# Patient Record
Sex: Female | Born: 1950 | Race: White | Hispanic: No | Marital: Married | Smoking: Never smoker
Health system: Southern US, Community
[De-identification: ages and names within clinical notes are randomized; demographics above are authoritative.]

## PROBLEM LIST (undated history)

## (undated) DIAGNOSIS — F32A Depression, unspecified: Secondary | ICD-10-CM

## (undated) DIAGNOSIS — F419 Anxiety disorder, unspecified: Secondary | ICD-10-CM

## (undated) DIAGNOSIS — I341 Nonrheumatic mitral (valve) prolapse: Secondary | ICD-10-CM

## (undated) DIAGNOSIS — J189 Pneumonia, unspecified organism: Secondary | ICD-10-CM

## (undated) DIAGNOSIS — J45909 Unspecified asthma, uncomplicated: Secondary | ICD-10-CM

## (undated) DIAGNOSIS — M8580 Other specified disorders of bone density and structure, unspecified site: Secondary | ICD-10-CM

## (undated) DIAGNOSIS — K589 Irritable bowel syndrome without diarrhea: Secondary | ICD-10-CM

## (undated) DIAGNOSIS — K219 Gastro-esophageal reflux disease without esophagitis: Secondary | ICD-10-CM

## (undated) DIAGNOSIS — G039 Meningitis, unspecified: Secondary | ICD-10-CM

## (undated) DIAGNOSIS — N200 Calculus of kidney: Secondary | ICD-10-CM

## (undated) DIAGNOSIS — F329 Major depressive disorder, single episode, unspecified: Secondary | ICD-10-CM

## (undated) DIAGNOSIS — T7840XA Allergy, unspecified, initial encounter: Secondary | ICD-10-CM

## (undated) HISTORY — PX: COSMETIC SURGERY: SHX468

## (undated) HISTORY — DX: Pneumonia, unspecified organism: J18.9

## (undated) HISTORY — DX: Depression, unspecified: F32.A

## (undated) HISTORY — DX: Unspecified asthma, uncomplicated: J45.909

## (undated) HISTORY — PX: KNEE ARTHROSCOPY: SUR90

## (undated) HISTORY — DX: Anxiety disorder, unspecified: F41.9

## (undated) HISTORY — PX: CHOLECYSTECTOMY: SHX55

## (undated) HISTORY — PX: OTHER SURGICAL HISTORY: SHX169

## (undated) HISTORY — DX: Meningitis, unspecified: G03.9

## (undated) HISTORY — DX: Calculus of kidney: N20.0

## (undated) HISTORY — DX: Allergy, unspecified, initial encounter: T78.40XA

## (undated) HISTORY — PX: CARPAL TUNNEL RELEASE: SHX101

## (undated) HISTORY — DX: Other specified disorders of bone density and structure, unspecified site: M85.80

## (undated) HISTORY — DX: Nonrheumatic mitral (valve) prolapse: I34.1

## (undated) HISTORY — PX: SPINE SURGERY: SHX786

## (undated) HISTORY — DX: Irritable bowel syndrome, unspecified: K58.9

## (undated) HISTORY — PX: APPENDECTOMY: SHX54

## (undated) HISTORY — PX: TUBAL LIGATION: SHX77

## (undated) HISTORY — PX: TONSILLECTOMY: SUR1361

## (undated) HISTORY — DX: Major depressive disorder, single episode, unspecified: F32.9

## (undated) HISTORY — DX: Gastro-esophageal reflux disease without esophagitis: K21.9

---

## 1998-09-05 HISTORY — PX: COLONOSCOPY: SHX174

## 2007-04-18 ENCOUNTER — Encounter: Admission: RE | Admit: 2007-04-18 | Discharge: 2007-07-17 | Payer: Self-pay | Admitting: Orthopaedic Surgery

## 2008-04-22 ENCOUNTER — Encounter: Admission: RE | Admit: 2008-04-22 | Discharge: 2008-04-22 | Payer: Self-pay | Admitting: Emergency Medicine

## 2009-07-06 ENCOUNTER — Encounter: Admission: RE | Admit: 2009-07-06 | Discharge: 2009-07-06 | Payer: Self-pay | Admitting: Obstetrics and Gynecology

## 2010-05-05 ENCOUNTER — Encounter (INDEPENDENT_AMBULATORY_CARE_PROVIDER_SITE_OTHER): Payer: Self-pay | Admitting: *Deleted

## 2010-10-05 NOTE — Letter (Signed)
Summary: Pre Visit Letter Revised  [facility]  [address] [place], [place] [address]   Phone: [phone]  Fax: [phone]        05/05/2010 MRN: [id number] [facility] [address] [place], [place]  [address]             Procedure Date:  05/17/2010   Welcome to the Gastroenterology Division at [facility].    You are scheduled to see a nurse for your pre-procedure visit on 05/07/2010 at 9:00AM on the 3rd floor at [facility], [address]  We ask that you try to arrive at our office 15 minutes prior to your appointment time to allow for check-in.  Please take a minute to review the attached form.  If you answer "Yes" to one or more of the questions on the first page, we ask that you call the person listed at your earliest opportunity.  If you answer "No" to all of the questions, please complete the rest of the form and bring it to your appointment.    Your nurse visit will consist of discussing your medical and surgical history, your immediate family medical history, and your medications.   If you are unable to list all of your medications on the form, please bring the medication bottles to your appointment and we will list them.  We will need to be aware of both prescribed and over the counter drugs.  We will need to know exact dosage information as well.    Please be prepared to read and sign documents such as consent forms, a financial agreement, and acknowledgement forms.  If necessary, and with your consent, a friend or relative is welcome to sit-in on the nurse visit with you.  Please bring your insurance card so that we may make a copy of it.  If your insurance requires a referral to see a specialist, please bring your referral form from your primary care physician.  No co-pay is required for this nurse visit.     If you cannot keep your appointment, please call [phone] to cancel or reschedule prior to your appointment date.   This allows [place] the opportunity to schedule an appointment for another patient in need of care.    Thank you for choosing [facility] for your medical needs.  We appreciate the opportunity to care for you.  Please visit [place] at our website  to learn more about our practice.  Sincerely, The Gastroenterology Division

## 2011-11-14 ENCOUNTER — Ambulatory Visit (INDEPENDENT_AMBULATORY_CARE_PROVIDER_SITE_OTHER): Payer: BC Managed Care – PPO | Admitting: Family Medicine

## 2011-11-14 DIAGNOSIS — F419 Anxiety disorder, unspecified: Secondary | ICD-10-CM | POA: Insufficient documentation

## 2011-11-14 DIAGNOSIS — F411 Generalized anxiety disorder: Secondary | ICD-10-CM

## 2011-11-14 DIAGNOSIS — J309 Allergic rhinitis, unspecified: Secondary | ICD-10-CM | POA: Insufficient documentation

## 2011-11-14 DIAGNOSIS — K219 Gastro-esophageal reflux disease without esophagitis: Secondary | ICD-10-CM | POA: Insufficient documentation

## 2011-11-14 MED ORDER — PANTOPRAZOLE SODIUM 40 MG PO TBEC: 40.0000 mg | DELAYED_RELEASE_TABLET | Freq: Every day | ORAL | Status: DC

## 2011-11-14 MED ORDER — ESCITALOPRAM OXALATE 20 MG PO TABS: 20.0000 mg | ORAL_TABLET | Freq: Every day | ORAL | Status: DC

## 2011-11-14 MED ORDER — ALPRAZOLAM 0.5 MG PO TABS: 0.5000 mg | ORAL_TABLET | ORAL | Status: DC | PRN

## 2011-11-14 NOTE — Progress Notes (Signed)
T40-year-old woman, married, works in a [employer] and needs refills on her medications. She has no new problems. Has 2 daughters living area one works at [employer]. and the other one works at [employer]. His intake has not been working and she would like to go back on her Protonix.  Objective: HEENT unremarkable  Chest: Clear  Heart: Regular no murmur  Abdomen: No HSM masses or tenderness  Assessment stable-doing well on current medications, with the exception of her GERD which needs to be better treated with Protonix- but does need refills on other medications  Plan: Refill Xanax x6 months, Lexapro x11 months, Protonix x11 months.

## 2012-04-06 ENCOUNTER — Ambulatory Visit (INDEPENDENT_AMBULATORY_CARE_PROVIDER_SITE_OTHER): Payer: BC Managed Care – PPO | Admitting: Family Medicine

## 2012-04-06 VITALS — BP 134/80 | HR 60 | Temp 98.2°F | Resp 16 | Ht 62.5 in | Wt 122.0 lb

## 2012-04-06 DIAGNOSIS — M79606 Pain in leg, unspecified: Secondary | ICD-10-CM

## 2012-04-06 DIAGNOSIS — N390 Urinary tract infection, site not specified: Secondary | ICD-10-CM

## 2012-04-06 DIAGNOSIS — M79609 Pain in unspecified limb: Secondary | ICD-10-CM

## 2012-04-06 DIAGNOSIS — R300 Dysuria: Secondary | ICD-10-CM

## 2012-04-06 DIAGNOSIS — J329 Chronic sinusitis, unspecified: Secondary | ICD-10-CM

## 2012-04-06 LAB — POCT URINALYSIS DIPSTICK
Blood, UA: NEGATIVE
Glucose, UA: NEGATIVE
Spec Grav, UA: 1.02
pH, UA: 7.5

## 2012-04-06 LAB — POCT UA - MICROSCOPIC ONLY: Mucus, UA: POSITIVE

## 2012-04-06 MED ORDER — OXAPROZIN 600 MG PO TABS: ORAL_TABLET | ORAL | Status: AC

## 2012-04-06 MED ORDER — LEVOFLOXACIN 500 MG PO TABS: 500.0000 mg | ORAL_TABLET | Freq: Every day | ORAL | Status: DC

## 2012-04-06 NOTE — Patient Instructions (Signed)
Take the anti-inflammatory medicine twice daily as needed for the leg pain. If pain problems persist return for further assessment.  Take the antibiotic one daily. This should cover both urine and sinus problems well.  Drink plenty of fluids.

## 2012-04-06 NOTE — Progress Notes (Signed)
     Results for orders placed in visit on 04/06/12  POCT URINALYSIS DIPSTICK      Component Value Range   Color, UA yellow     Clarity, UA cloudy     Glucose, UA neg     Bilirubin, UA neg     Ketones, UA neg     Spec Grav, UA 1.020     Blood, UA neg     pH, UA 7.5     Protein, UA trace     Urobilinogen, UA 0.2     Nitrite, UA neg     Leukocytes, UA small (1+)    POCT UA - MICROSCOPIC ONLY      Component Value Range   WBC, Ur, HPF, POC tntc     RBC, urine, microscopic 2-7     Bacteria, U Microscopic 4+     Mucus, UA pos     Epithelial cells, urine per micros 4-6     Crystals, Ur, HPF, POC neg     Casts, Ur, LPF, POC neg     Yeast, UA neg

## 2012-04-28 ENCOUNTER — Other Ambulatory Visit: Payer: Self-pay | Admitting: Family Medicine

## 2012-06-10 ENCOUNTER — Encounter: Payer: Self-pay | Admitting: Internal Medicine

## 2012-06-10 ENCOUNTER — Ambulatory Visit: Payer: Self-pay | Admitting: Internal Medicine

## 2012-06-10 VITALS — BP 120/72 | HR 64 | Temp 98.0°F | Resp 16 | Ht 62.5 in | Wt 122.4 lb

## 2012-06-10 DIAGNOSIS — Z7189 Other specified counseling: Secondary | ICD-10-CM

## 2012-06-10 DIAGNOSIS — F419 Anxiety disorder, unspecified: Secondary | ICD-10-CM

## 2012-06-10 DIAGNOSIS — F411 Generalized anxiety disorder: Secondary | ICD-10-CM

## 2012-06-10 MED ORDER — ALPRAZOLAM 0.5 MG PO TABS: 0.5000 mg | ORAL_TABLET | Freq: Two times a day (BID) | ORAL | Status: DC | PRN

## 2012-06-10 NOTE — Patient Instructions (Addendum)
Stress Stress-related medical problems are becoming increasingly common. The body has a built-in physical response to stressful situations. Faced with pressure, challenge or danger, we need to react quickly. Our bodies release hormones such as cortisol and adrenaline to help do this. These hormones are part of the "fight or flight" response and affect the metabolic rate, heart rate and blood pressure, resulting in a heightened, stressed state that prepares the body for optimum performance in dealing with a stressful situation. It is likely that early man required these mechanisms to stay alive, but usually modern stresses do not call for this, and the same hormones released in today's world can damage health and reduce coping ability. CAUSES  Pressure to perform at work, at school or in sports.  Threats of physical violence.  Money worries.  Arguments.  Family conflicts.  Divorce or separation from significant other.  Bereavement.  New job or unemployment.  Changes in location.  Alcohol or drug abuse. SOMETIMES, THERE IS NO PARTICULAR REASON FOR DEVELOPING STRESS. Almost all people are at risk of being stressed at some time in their lives. It is important to know that some stress is temporary and some is long term.  Temporary stress will go away when a situation is resolved. Most people can cope with short periods of stress, and it can often be relieved by relaxing, taking a walk, chatting through issues with friends, or having a good night's sleep.  Chronic (long-term, continuous) stress is much harder to deal with. It can be psychologically and emotionally damaging. It can be harmful both for an individual and for friends and family. SYMPTOMS Everyone reacts to stress differently. There are some common effects that help us recognize it. In times of extreme stress, people may:  Shake uncontrollably.  Breathe faster and deeper than normal (hyperventilate).  Vomit.  For people  with asthma, stress can trigger an attack.  For some people, stress may trigger migraine headaches, ulcers, and body pain. PHYSICAL EFFECTS OF STRESS MAY INCLUDE:  Loss of energy.  Skin problems.  Aches and pains resulting from tense muscles, including neck ache, backache and tension headaches.  Increased pain from arthritis and other conditions.  Irregular heart beat (palpitations).  Periods of irritability or anger.  Apathy or depression.  Anxiety (feeling uptight or worrying).  Unusual behavior.  Loss of appetite.  Comfort eating.  Lack of concentration.  Loss of, or decreased, sex-drive.  Increased smoking, drinking, or recreational drug use.  For women, missed periods.  Ulcers, joint pain, and muscle pain. Post-traumatic stress is the stress caused by any serious accident, strong emotional damage, or extremely difficult or violent experience such as rape or war. Post-traumatic stress victims can experience mixtures of emotions such as fear, shame, depression, guilt or anger. It may include recurrent memories or images that may be haunting. These feelings can last for weeks, months or even years after the traumatic event that triggered them. Specialized treatment, possibly with medicines and psychological therapies, is available. If stress is causing physical symptoms, severe distress or making it difficult for you to function as normal, it is worth seeing your caregiver. It is important to remember that although stress is a usual part of life, extreme or prolonged stress can lead to other illnesses that will need treatment. It is better to visit a doctor sooner rather than later. Stress has been linked to the development of high blood pressure and heart disease, as well as insomnia and depression. There is no diagnostic test for   stress since everyone reacts to it differently. But a caregiver will be able to spot the physical symptoms, such  as:  Headaches.  Shingles.  Ulcers. Emotional distress such as intense worry, low mood or irritability should be detected when the doctor asks pertinent questions to identify any underlying problems that might be the cause. In case there are physical reasons for the symptoms, the doctor may also want to do some tests to exclude certain conditions. If you feel that you are suffering from stress, try to identify the aspects of your life that are causing it. Sometimes you may not be able to change or avoid them, but even a small change can have a positive ripple effect. A simple lifestyle change can make all the difference. STRATEGIES THAT CAN HELP DEAL WITH STRESS:  Delegating or sharing responsibilities.  Avoiding confrontations.  Learning to be more assertive.  Regular exercise.  Avoid using alcohol or street drugs to cope.  Eating a healthy, balanced diet, rich in fruit and vegetables and proteins.  Finding humor or absurdity in stressful situations.  Never taking on more than you know you can handle comfortably.  Organizing your time better to get as much done as possible.  Talking to friends or family and sharing your thoughts and fears.  Listening to music or relaxation tapes.  Tensing and then relaxing your muscles, starting at the toes and working up to the head and neck. If you think that you would benefit from help, either in identifying the things that are causing your stress or in learning techniques to help you relax, see a caregiver who is capable of helping you with this. Rather than relying on medications, it is usually better to try and identify the things in your life that are causing stress and try to deal with them. There are many techniques of managing stress including counseling, psychotherapy, aromatherapy, yoga, and exercise. Your caregiver can help you determine what is best for you. Document Released: 11/12/2002 Document Revised: 11/14/2011 Document  Reviewed: 10/09/2007 ExitCare Patient Information 2013 [employer]. Anxiety and Panic Attacks Your caregiver has informed you that you are having an anxiety or panic attack. There may be many forms of this. Most of the time these attacks come suddenly and without warning. They come at any time of day, including periods of sleep, and at any time of life. They may be strong and unexplained. Although panic attacks are very scary, they are physically harmless. Sometimes the cause of your anxiety is not known. Anxiety is a protective mechanism of the body in its fight or flight mechanism. Most of these perceived danger situations are actually nonphysical situations (such as anxiety over losing a job). CAUSES  The causes of an anxiety or panic attack are many. Panic attacks may occur in otherwise healthy people given a certain set of circumstances. There may be a genetic cause for panic attacks. Some medications may also have anxiety as a side effect. SYMPTOMS  Some of the most common feelings are:  Intense terror.  Dizziness, feeling faint.  Hot and cold flashes.  Fear of going crazy.  Feelings that nothing is real.  Sweating.  Shaking.  Chest pain or a fast heartbeat (palpitations).  Smothering, choking sensations.  Feelings of impending doom and that death is near.  Tingling of extremities, this may be from over-breathing.  Altered reality (derealization).  Being detached from yourself (depersonalization). Several symptoms can be present to make up anxiety or panic attacks. DIAGNOSIS  The evaluation   by your caregiver will depend on the type of symptoms you are experiencing. The diagnosis of anxiety or panic attack is made when no physical illness can be determined to be a cause of the symptoms. TREATMENT  Treatment to prevent anxiety and panic attacks may include:  Avoidance of circumstances that cause anxiety.  Reassurance and relaxation.  Regular exercise.  Relaxation  therapies, such as yoga.  Psychotherapy with a psychiatrist or therapist.  Avoidance of caffeine, alcohol and illegal drugs.  Prescribed medication. SEEK IMMEDIATE MEDICAL CARE IF:   You experience panic attack symptoms that are different than your usual symptoms.  You have any worsening or concerning symptoms. Document Released: 08/22/2005 Document Revised: 11/14/2011 Document Reviewed: 12/24/2009 ExitCare Patient Information 2013 [employer].  

## 2012-06-10 NOTE — Progress Notes (Signed)
  Subjective:    Patient ID: Tina Bray, female    DOB: 11/25/1950, 61 y.o.   MRN: [id number]  HPI Stable and doing well Stress up at work Husband has new job and both do not have insurance. Has counseled in the past  Review of Systems     Objective:   Physical Exam Cor nl       Assessment & Plan:  Refill meds 6 months Exercise

## 2012-08-13 ENCOUNTER — Ambulatory Visit: Payer: Self-pay | Admitting: Internal Medicine

## 2012-08-13 VITALS — BP 147/72 | HR 69 | Temp 98.6°F | Resp 18 | Ht 63.5 in | Wt 123.8 lb

## 2012-08-13 DIAGNOSIS — J329 Chronic sinusitis, unspecified: Secondary | ICD-10-CM

## 2012-08-13 MED ORDER — CIPROFLOXACIN HCL 500 MG PO TABS: 500.0000 mg | ORAL_TABLET | Freq: Two times a day (BID) | ORAL | Status: DC

## 2012-08-13 NOTE — Progress Notes (Signed)
  Subjective:    Patient ID: Tina Bray, female    DOB: [birth year], 61 y.o.   MRN: [id number]  HPI Pt presents to clinic today with some sinusitis complaints. Stuffy, bloody nose, drainage in her throat and a dried out feeling.   Review of Systems     Objective:   Physical Exam        Assessment & Plan:

## 2012-08-13 NOTE — Progress Notes (Signed)
  Subjective:    Patient ID: Tina Bray, female    DOB: 03-12-1951, 61 y.o.   MRN: [id number]  HPI had a flulike illness for 4 days which has developed  into epistaxis with purulent nasal discharge and sinus pressure/no fever/sore throat only in the morning/cough is minimal and nonproductive Has a history of recurrent sinus infections    Review of Systems     Objective:   Physical Exam Vital signs stable Conjunctiva clear Tympanic membranes clear Nares with purulent discharge/there several bleeding points on the turbinates and nasal septum Oral pharynx is clear except for purulent postnasal drainage No anterior cervical notes Lungs clear to auscultation       Assessment & Plan:  Problem #1 sinusitis  History of penicillin allergy/history of not responding to Zithromax/history of doing well with Cipro Meds ordered this encounter  Medications         . ciprofloxacin (CIPRO) 500 MG tablet    Sig: Take 1 tablet (500 mg total) by mouth 2 (two) times daily.    Dispense:  20 tablet    Refill:  0   otc meds

## 2012-11-07 ENCOUNTER — Other Ambulatory Visit: Payer: Self-pay | Admitting: Family Medicine

## 2012-11-12 ENCOUNTER — Ambulatory Visit: Payer: Self-pay | Admitting: Family Medicine

## 2012-11-12 VITALS — BP 122/74 | HR 68 | Temp 98.2°F | Resp 16 | Ht 62.0 in | Wt 125.0 lb

## 2012-11-12 DIAGNOSIS — K219 Gastro-esophageal reflux disease without esophagitis: Secondary | ICD-10-CM

## 2012-11-12 DIAGNOSIS — F411 Generalized anxiety disorder: Secondary | ICD-10-CM

## 2012-11-12 DIAGNOSIS — J309 Allergic rhinitis, unspecified: Secondary | ICD-10-CM

## 2012-11-12 MED ORDER — PANTOPRAZOLE SODIUM 40 MG PO TBEC: 40.0000 mg | DELAYED_RELEASE_TABLET | Freq: Every day | ORAL | Status: DC

## 2012-11-12 MED ORDER — ESCITALOPRAM OXALATE 20 MG PO TABS: 20.0000 mg | ORAL_TABLET | Freq: Every day | ORAL | Status: DC

## 2012-11-12 NOTE — Patient Instructions (Signed)
I have sent in your medicines for you.  They should be waiting at the pharmacy.  Let us know if the nasal sprays aren't helping anymore.    It was good to meet you today!

## 2012-11-12 NOTE — Progress Notes (Signed)
Tina Bray is a 62 y.o. female who presents to [facility] today with multiple complaints:    1.  Anxiety:  Present for past 4-5 years.  She has been tried on multiple medications and has been on combination of Xanax and Lexapro for over 2 years.  This seems to have calmed her nerves the most.  No SI/HI.  No symptoms of depression.  No panic attacks for over a year.  Requesting refill for Lexapro today.  Describes anxiety as worse without medications and aggravated by going out in public.   2.  GERD:  Present for years as well.  Has been on multipole PPIs, currently controlled on Protonix.  Asking for refill for this today as well.  Worsened by spicy foods.   3.  Allergic rhinitis:  Chronic.  Describes rhinorrhea which is clear as well as runny, itchy eyes.  Worse in fall and spring.  Has just started this past weekend to flare up.  Has not tried any OTC 2nd gen H1 blockers, only benadryl.  Does have multiple nasal sprays at home but doesn't know what these actually do.  No fevers/chills.   The following portions of the patient's history were reviewed and updated as appropriate: allergies, current medications, past medical history, family and social history, and problem list.  Patient is a nonsmoker.    Past Medical History  Diagnosis Date  . Allergy   . Depression   . Asthma   . Anxiety    Past Surgical History  Procedure Laterality Date  . Appendectomy    . Cholecystectomy    . Cosmetic surgery    . Spine surgery    . Tubal ligation    . Carpal tunnel release    . Orthoscopic shoulder      right and left shoulder    Medications reviewed. Current Outpatient Prescriptions  Medication Sig Dispense Refill  . ALPRAZolam (XANAX) 0.5 MG tablet Take 1 tablet (0.5 mg total) by mouth 2 (two) times daily as needed.  60 tablet  3  . b complex vitamins tablet Take 1 tablet by mouth daily.      . cycloSPORINE (RESTASIS) 0.05 % ophthalmic emulsion 1 drop 2 (two) times daily.      Marland Kitchen  escitalopram (LEXAPRO) 20 MG tablet Take 1 tablet (20 mg total) by mouth daily.  90 tablet  3  . ipratropium (ATROVENT) 0.03 % nasal spray USE TWO DOSES PER NOSTRIL EVERY 6 HOURS AS NEEDED  30 mL  1  . oxaprozin (DAYPRO) 600 MG tablet One twice daily with food for leg pain  30 tablet  1  . pantoprazole (PROTONIX) 40 MG tablet Take 1 tablet (40 mg total) by mouth daily.  90 tablet  2  . ranitidine (ZANTAC) 75 MG tablet Take 75 mg by mouth 2 (two) times daily.      . cetirizine (ZYRTEC) 10 MG tablet Take 10 mg by mouth daily.      . ciprofloxacin (CIPRO) 500 MG tablet Take 1 tablet (500 mg total) by mouth 2 (two) times daily.  20 tablet  0  . diphenhydrAMINE (BENADRYL) 25 MG tablet Take 25 mg by mouth at bedtime and may repeat dose one time if needed.      . loteprednol (LOTEMAX) 0.2 % SUSP 1 drop 4 (four) times daily.       No current facility-administered medications for this visit.    ROS as above otherwise neg.  No chest pain, palpitations, SOB, Fever, Chills,  Abd pain, N/V/D.  Physical Exam:  BP 122/74  Pulse 68  Temp(Src) 98.2 F (36.8 C) (Oral)  Resp 16  Ht 5\' 2"  (1.575 m)  Wt 125 lb (56.7 kg)  BMI 22.86 kg/m2  SpO2 99% Gen:  Alert, cooperative patient who appears stated age in no acute distress.  Vital signs reviewed. HEENT:  No nasal/ocular drainage currently.  EOMI,  MMM Pulm:  Clear to auscultation bilaterally with good air movement.  No wheezes or rales noted.   Cardiac:  Regular rate and rhythm without murmur auscultated.  Good S1/S2. Abd:  Soft/nondistended/nontender.  Good bowel sounds throughout all four quadrants.  No masses noted.  Exts: Non edematous BL  LE, warm and well perfused. Psych:  Pleasant, conversant, not anxious appearing currently.    No results found for this or any previous visit (from the past 72 hour(s)).  Assessment/Plan: 1.  Anxiety: - Controlled on lexapro, refill provided today  2.  GERD: - Controlled on Protonix, refill provided  3.   Allergic rhinitis: - Discussed purpose of intranasal corticosteroids.   - She has multiple of these at home, no refills needed - TO attempt intranasal steroids and OTC H1 blockers for now.  If no relief in 2-3 weeks she is to return for possible referral to allergy specialist.  She has seen several of these in the past evidently.

## 2013-07-03 ENCOUNTER — Ambulatory Visit: Payer: Self-pay

## 2013-07-03 ENCOUNTER — Ambulatory Visit: Payer: Self-pay | Admitting: Family Medicine

## 2013-07-03 VITALS — BP 144/80 | HR 65 | Temp 98.3°F | Resp 18 | Ht 62.5 in | Wt 126.0 lb

## 2013-07-03 DIAGNOSIS — S161XXA Strain of muscle, fascia and tendon at neck level, initial encounter: Secondary | ICD-10-CM

## 2013-07-03 DIAGNOSIS — M542 Cervicalgia: Secondary | ICD-10-CM

## 2013-07-03 DIAGNOSIS — S43499A Other sprain of unspecified shoulder joint, initial encounter: Secondary | ICD-10-CM

## 2013-07-03 DIAGNOSIS — S46812A Strain of other muscles, fascia and tendons at shoulder and upper arm level, left arm, initial encounter: Secondary | ICD-10-CM

## 2013-07-03 DIAGNOSIS — M545 Low back pain, unspecified: Secondary | ICD-10-CM

## 2013-07-03 DIAGNOSIS — S139XXA Sprain of joints and ligaments of unspecified parts of neck, initial encounter: Secondary | ICD-10-CM

## 2013-07-03 MED ORDER — DICLOFENAC SODIUM 75 MG PO TBEC: 75.0000 mg | DELAYED_RELEASE_TABLET | Freq: Two times a day (BID) | ORAL | Status: DC

## 2013-07-03 MED ORDER — METAXALONE 800 MG PO TABS: 800.0000 mg | ORAL_TABLET | Freq: Three times a day (TID) | ORAL | Status: DC
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
## 2013-07-03 NOTE — Patient Instructions (Signed)
Take the Skelaxin one half to one pill 3 times daily for muscle relaxant as needed  Take the diclofenac( voltaren) one twice daily with breakfast and supper  Return only if needed

## 2013-08-28 ENCOUNTER — Ambulatory Visit: Payer: Self-pay | Admitting: Family Medicine

## 2013-08-28 VITALS — BP 118/78 | HR 79 | Temp 99.3°F | Resp 16 | Ht 62.5 in | Wt 124.6 lb

## 2013-08-28 DIAGNOSIS — Z7189 Other specified counseling: Secondary | ICD-10-CM

## 2013-08-28 DIAGNOSIS — F419 Anxiety disorder, unspecified: Secondary | ICD-10-CM

## 2013-08-28 DIAGNOSIS — J069 Acute upper respiratory infection, unspecified: Secondary | ICD-10-CM

## 2013-08-28 MED ORDER — HYDROCODONE-HOMATROPINE 5-1.5 MG/5ML PO SYRP: 5.0000 mL | ORAL_SOLUTION | Freq: Three times a day (TID) | ORAL | Status: DC | PRN

## 2013-08-28 MED ORDER — CIPROFLOXACIN HCL 500 MG PO TABS: 500.0000 mg | ORAL_TABLET | Freq: Two times a day (BID) | ORAL | Status: DC

## 2013-08-28 MED ORDER — ALPRAZOLAM 0.5 MG PO TABS: 0.5000 mg | ORAL_TABLET | Freq: Two times a day (BID) | ORAL | Status: DC | PRN

## 2013-08-28 NOTE — Progress Notes (Signed)
°  This chart was scribed for Elvina Sidle, MD by Joaquin Music, ED Scribe. This patient was seen in room Room/bed 10 and the patient's care was started at 7:54 AM Subjective:    Patient ID: Tina Bray, female    DOB: [birth year], 62 y.o.   MRN: [id number] Chief Complaint  Patient presents with   Cough    symptoms since monday    Otalgia   Headache   Sore Throat   HPI Tina Bray is a 62 y.o. female who presents to the [facility] complaining of ongoing cough, otalgia, HA, and sore throat for the past 3 week. Pt states her husband was recently sick and seen at [facility]. Pt denies hx of asthma but states she has had bronchitis. She states she has an inhaler but denies using it since she has moved from [place] this year (2014). Pt denies hx of smoking.  Pt requested to have her Zanax rx refilled.  Pt states she works for [employer]. 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  HENT: Positive for congestion, ear pain, rhinorrhea and sore throat.   Respiratory: Positive for cough.   Neurological: Positive for headaches.   Objective:   Physical Exam  Constitutional: She is oriented to person, place, and time. She appears well-developed and well-nourished.  HENT:  Unremarkable except for mucopurulent from nose  Eyes: Conjunctivae are normal. Pupils are equal, round, and reactive to light.  Neck: Neck supple.  Cardiovascular: Normal rate, regular rhythm and normal heart sounds.   No murmur heard. Pulmonary/Chest: Breath sounds normal. No respiratory distress. She has no wheezes. She has no rales.  Lymphadenopathy:    She has no cervical adenopathy.  Neurological: She is alert and oriented to person, place, and time.  Skin: Skin is warm and dry. No rash noted. No erythema.  Without suspicious lesions   Psychiatric: She has a normal mood and affect. Her behavior is normal.    Triage Vitals:BP 118/78   Pulse 79   Temp(Src) 99.3 F (37.4 C) (Oral)   Resp 16   Ht 5' 2.5" (1.588 m)   Wt 124 lb 9.6 oz (56.518 kg)   BMI 22.41 kg/m2   SpO2 96% Assessment & Plan:   1. Anxiety   2. Encounter for medication review and counseling   3. URI (upper respiratory infection)     Anxiety - Plan: ALPRAZolam (XANAX) 0.5 MG tablet  Encounter for medication review and counseling - Plan: ALPRAZolam (XANAX) 0.5 MG tablet  URI (upper respiratory infection) - Plan: ciprofloxacin (CIPRO) 500 MG tablet, HYDROcodone-homatropine (HYCODAN) 5-1.5 MG/5ML syrup  Signed, Elvina Sidle, MD

## 2013-11-19 ENCOUNTER — Ambulatory Visit: Payer: BC Managed Care – PPO | Admitting: Family Medicine

## 2013-11-19 VITALS — BP 132/84 | HR 81 | Temp 98.4°F | Resp 18 | Ht 62.5 in | Wt 124.0 lb

## 2013-11-19 DIAGNOSIS — K219 Gastro-esophageal reflux disease without esophagitis: Secondary | ICD-10-CM

## 2013-11-19 DIAGNOSIS — F411 Generalized anxiety disorder: Secondary | ICD-10-CM

## 2013-11-19 MED ORDER — ESCITALOPRAM OXALATE 20 MG PO TABS: 20.0000 mg | ORAL_TABLET | Freq: Every day | ORAL | Status: DC

## 2013-11-19 MED ORDER — PANTOPRAZOLE SODIUM 40 MG PO TBEC: 40.0000 mg | DELAYED_RELEASE_TABLET | Freq: Every day | ORAL | Status: DC

## 2013-11-19 NOTE — Progress Notes (Signed)
° °  Subjective:    Patient ID: Tina Bray, female    DOB: 1950-10-15, 63 y.o.   MRN: [id number] This chart was scribed for Tina Haber, MD by Anastasia Pall, ED Scribe. This patient was seen in room 01 and the patient's care was started at 3:29 PM.  Chief Complaint  Patient presents with   rx refills    lexapro, protonix, estradiol   HPI Tina Bray is a 63 y.o. female Pt presents for Rx refills of Lexapro, Protonix, and Estrace. She states her medications have been working well for her and has been on them for a long time. She denies any other requests, denies symptoms. She states she is due for mammogram and colonoscopy. She reports working at [employer] for her profession.   PCP Reginia Forts, MD  Patient Active Problem List   Diagnosis Date Noted   Anxiety 11/14/2011   GERD (gastroesophageal reflux disease) 11/14/2011   Allergic rhinitis 11/14/2011   Prior to Admission medications   Medication Sig Start Date End Date Taking? Authorizing Provider  ALPRAZolam Duanne Moron) 0.5 MG tablet Take 1 tablet (0.5 mg total) by mouth 2 (two) times daily as needed. 08/28/13  Yes Tina Haber, MD  b complex vitamins tablet Take 1 tablet by mouth daily.   Yes Historical Provider, MD  cetirizine (ZYRTEC) 10 MG tablet Take 10 mg by mouth daily.   Yes Historical Provider, MD  escitalopram (LEXAPRO) 20 MG tablet Take 1 tablet (20 mg total) by mouth daily. 11/12/12  Yes Alveda Reasons, MD  estradiol (ESTRACE) 0.1 MG/GM vaginal cream Place 0.2 Applicatorfuls vaginally once a week. 2 times a week   Yes Historical Provider, MD  pantoprazole (PROTONIX) 40 MG tablet Take 1 tablet (40 mg total) by mouth daily. 11/12/12  Yes Alveda Reasons, MD   Review of Systems  Constitutional: Negative for fever.  Psychiatric/Behavioral: Negative for dysphoric mood. The patient is not nervous/anxious.    BP 132/84   Pulse 81   Temp(Src) 98.4 F (36.9 C) (Oral)   Resp 18   Ht 5' 2.5" (1.588  m)   Wt 124 lb (56.246 kg)   BMI 22.30 kg/m2   SpO2 98%    Objective:   Physical Exam Nursing note and vitals reviewed. Constitutional: Pt is oriented to person, place, and time. Pt appears well-developed and well-nourished. No distress.  HENT: Right and left external ear normal.  Head: Normocephalic and atraumatic.  Eyes: EOM are normal.  Neck: Neck supple. No thyromegaly. No cervical adenopathy.  Cardiovascular: Normal rate, regular rhythm and normal heart sounds.  Exam reveals no gallop and no friction rub. No murmur heard. Pulmonary/Chest: Effort normal and breath sounds normal. No respiratory distress. Pt has no wheezes. Pt has no rales.  Abdominal: Soft. Bowel sounds are normal. There is no tenderness. There is no rebound and no guarding. No hepatosplenomegaly. No CVA tenderness.  Musculoskeletal: Normal range of motion. No tenderness. No edema.   Neurological: Pt is alert and oriented to person, place, and time.  Skin: Skin is warm and dry.  Psychiatric: Pt has a normal mood and affect. Pt's behavior is normal.     Assessment & Plan:   GERD (gastroesophageal reflux disease) - Plan: pantoprazole (PROTONIX) 40 MG tablet  Anxiety state, unspecified - Plan: escitalopram (LEXAPRO) 20 MG tablet  Estrogen gel also refilled. Signed, Tina Haber, MD

## 2013-12-11 ENCOUNTER — Encounter: Payer: Self-pay | Admitting: Family Medicine

## 2013-12-11 ENCOUNTER — Ambulatory Visit (INDEPENDENT_AMBULATORY_CARE_PROVIDER_SITE_OTHER): Payer: BC Managed Care – PPO | Admitting: Family Medicine

## 2013-12-11 VITALS — BP 131/74 | HR 65 | Temp 98.6°F | Resp 16 | Ht 62.5 in | Wt 124.4 lb

## 2013-12-11 DIAGNOSIS — Z01419 Encounter for gynecological examination (general) (routine) without abnormal findings: Secondary | ICD-10-CM

## 2013-12-11 DIAGNOSIS — F419 Anxiety disorder, unspecified: Secondary | ICD-10-CM

## 2013-12-11 DIAGNOSIS — Z0000 Encounter for general adult medical examination without abnormal findings: Secondary | ICD-10-CM

## 2013-12-11 DIAGNOSIS — Z23 Encounter for immunization: Secondary | ICD-10-CM

## 2013-12-11 DIAGNOSIS — Z1239 Encounter for other screening for malignant neoplasm of breast: Secondary | ICD-10-CM

## 2013-12-11 DIAGNOSIS — Z1211 Encounter for screening for malignant neoplasm of colon: Secondary | ICD-10-CM

## 2013-12-11 LAB — CBC WITH DIFFERENTIAL/PLATELET
Basophils Absolute: 0 10*3/uL (ref 0.0–0.1)
Basophils Relative: 1 % (ref 0–1)
Eosinophils Absolute: 0.1 10*3/uL (ref 0.0–0.7)
Eosinophils Relative: 3 % (ref 0–5)
HEMATOCRIT: 42.6 % (ref 36.0–46.0)
HEMOGLOBIN: 14.2 g/dL (ref 12.0–15.0)
Lymphocytes Relative: 37 % (ref 12–46)
Lymphs Abs: 1.5 10*3/uL (ref 0.7–4.0)
MCH: 29.7 pg (ref 26.0–34.0)
MCHC: 33.3 g/dL (ref 30.0–36.0)
MCV: 89.1 fL (ref 78.0–100.0)
Monocytes Absolute: 0.2 10*3/uL (ref 0.1–1.0)
Monocytes Relative: 6 % (ref 3–12)
NEUTROS ABS: 2.2 10*3/uL (ref 1.7–7.7)
Neutrophils Relative %: 53 % (ref 43–77)
PLATELETS: 237 10*3/uL (ref 150–400)
RBC: 4.78 MIL/uL (ref 3.87–5.11)
RDW: 13.8 % (ref 11.5–15.5)
WBC: 4.1 10*3/uL (ref 4.0–10.5)

## 2013-12-11 LAB — POCT URINALYSIS DIPSTICK
BILIRUBIN UA: NEGATIVE
GLUCOSE UA: NEGATIVE
Ketones, UA: NEGATIVE
Leukocytes, UA: NEGATIVE
Nitrite, UA: NEGATIVE
Protein, UA: NEGATIVE
RBC UA: NEGATIVE
Spec Grav, UA: 1.025
UROBILINOGEN UA: 0.2
pH, UA: 5.5

## 2013-12-11 LAB — IFOBT (OCCULT BLOOD): IFOBT: NEGATIVE

## 2013-12-11 NOTE — Progress Notes (Signed)
Subjective:   This chart was scribed for Tina Honour, MD by [name], [facility] Scribe. This patient was seen in room 22 and the patient's care was started 2:02 PM.    Patient ID: Tina Bray, female    DOB: 03-20-1951, 63 y.o.   MRN: [id number]  12/11/2013  Annual Exam, Hand Pain and Elbow Pain   HPI  HPI Comments: Tina Bray is a 63 y.o. female who presents to [facility] for a complete physical examination today.  Pt has fragrance induced Asthma. She is not established with an allergist here in [place] just yet.  Her PSHx includes Appendectomy, cholecystomy , cervical disc fusion of 5 and 6, rotator cuff surgery, carpel tunnel bilaterally, and a Tonsillectomy.  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 States she experienced ongoing pain and tingling after her surgery and underwent a second surgery to manage symptoms.  Pt was hospitalized when she broke her Tibia. She was admitted to monitor swelling and to manage pain.  She exercises regularly, consisting of walking and running.  Pt states her hearing is well. No dizziness or tinnitus. Reports HA with worsening stress. Denies any associated nausea or vomiting with her HA.  Experiences her heart racing with worsening anxiety. However, she denies any racing of her heart at baseline.  States she using sun screen regularly. However, she denies using any on her face.  Pt reports regular bowel movements, however, not every single day. States she experiences constipation intermittently which resolves with OTC supplements.  Denies having to get up in the middle of the night to use the rest room.  She uses her Xanax as needed, about 2 halfs within 1 week period. She is also taking B complex, Zyrtec, vitamin D supplement, Lexapro, and Estrace, and Protonix.  At this time she denies any mouth sores, blurred vision, chest  pain with or without exertion, SOB, cough, numbness, tingling, or neck pain.  Denies any breast cancer in her family.  Mother is living, now age 41. She was diagnosed with cancer, unaware of what kind. She also has HTN and heart issues, had stents placed 6 years ago. Denies any MI's. Father passed at age 24 of a stroke. Pt has a sister, unaware of health status. She has an older brother, and states he is in good health with an known heart condition- Unaware of any details. Younger brother has back problems and Hepatitis D.  Pt has been happily married for 14 years. Denies any abuse in current marriage. 2 daughters by her first husband. 105 27 year old grandson. She lives with her husband and cat. She is currently a Sales promotion account executive. She moved from [place] and is currently happy in [place].   Last CPE: November 2013- Dr. Gertie Fey at [facility] Last Pap Smear: November 2013- Dr. Gertie Fey at [facility] Bone Density: November 2013- Dr. Gertie Fey at [facility]; Osteopenia Last Mammogram: November 2013- Dr. Gertie Fey at [facility] Last Tetanus: 2001 Shingles Vaccination: Has never had this vaccination  Flu Vaccination: Received one for this season Pneumonia Vaccination: Over 10 years- From allergist Last Dental visit: She has seen her dentist within the last few months Last Eye visit: January 2015- Starting to get a cataract in R eye Last Colonoscopy: At age 66- Completely normal without polyps-plans to schedule repeat colonoscopy with Dr. Louretta Shorten at [facility]  Constitutional: Negative for fever, chills, diaphoresis, activity change, appetite change, fatigue and unexpected weight change.  HENT: Negative for congestion, ear pain, hearing loss, postnasal drip, rhinorrhea, sinus pressure, sore throat, tinnitus and trouble swallowing.   Eyes: Negative for pain, discharge, redness, itching and visual disturbance.  Respiratory: Negative for  cough, choking, chest tightness, shortness of breath, wheezing and stridor.   Cardiovascular: Negative for chest pain, palpitations and leg swelling.  Gastrointestinal: Negative for nausea, vomiting, abdominal pain, diarrhea, constipation, blood in stool, abdominal distention, anal bleeding and rectal pain.  Endocrine: Negative for cold intolerance, heat intolerance, polydipsia, polyphagia and polyuria.  Genitourinary: Positive for dyspareunia. Negative for dysuria, frequency, hematuria, flank pain, vaginal bleeding, vaginal discharge, enuresis, difficulty urinating, genital sores and pelvic pain.  Musculoskeletal: Positive for arthralgias. Negative for back pain, gait problem, joint swelling, myalgias, neck pain and neck stiffness.  Skin: Negative for color change, pallor, rash and wound.  Allergic/Immunologic: Positive for environmental allergies.  Neurological: Negative for dizziness, tremors, seizures, syncope, facial asymmetry, speech difficulty, weakness, light-headedness, numbness and headaches.  Hematological: Negative for adenopathy. Does not bruise/bleed easily.  Psychiatric/Behavioral: Negative for suicidal ideas, behavioral problems, confusion, sleep disturbance, self-injury and dysphoric mood. The patient is nervous/anxious.   All other systems reviewed and are negative.   Past Medical History  Diagnosis Date  . Allergy   . Depression   . Asthma   . Anxiety    Past Surgical History  Procedure Laterality Date  . Appendectomy    . Cholecystectomy    . Cosmetic surgery    . Tubal ligation    . Carpal tunnel release    . Orthoscopic shoulder      right and left shoulder  . Colonoscopy  09/05/1998    normal; repeat in ten years.  . Tonsillectomy    . Spine surgery      cervical spine C5-6    Allergies  Allergen Reactions  . Erythromycin Anaphylaxis  . Demerol Other (See Comments)    blisters  . Penicillins Hives  . Sulfa Antibiotics Hives   Current Outpatient  Prescriptions  Medication Sig Dispense Refill  . ALPRAZolam (XANAX) 0.5 MG tablet Take 1 tablet (0.5 mg total) by mouth 2 (two) times daily as needed.  60 tablet  3  . b complex vitamins tablet Take 1 tablet by mouth daily.      . cetirizine (ZYRTEC) 10 MG tablet Take 10 mg by mouth daily.      Marland Kitchen escitalopram (LEXAPRO) 20 MG tablet Take 1 tablet (20 mg total) by mouth daily.  90 tablet  3  . estradiol (ESTRACE) 0.1 MG/GM vaginal cream Place 0.2 Applicatorfuls vaginally once a week. 2 times a week      . pantoprazole (PROTONIX) 40 MG tablet Take 1 tablet (40 mg total) by mouth daily.  90 tablet  2   No current facility-administered medications for this visit.   History   Social History  . Marital Status: Married    Spouse Name: N/A    Number of Children: N/A  . Years of Education: N/A   Occupational History  . Not on file.   Social History Main Topics  . Smoking status: Never Smoker   . Smokeless tobacco: Never Used  . Alcohol Use: 2.4 oz/week    4 Glasses of wine per week  . Drug Use: No  . Sexual Activity: Yes    Birth Control/ Protection: None   Other Topics Concern  . Not on file  Social History Narrative   Marital status:  Married x 14 years; happily married.  No abuse.  Moved from [place].      Children:  2 daughters; 1 grandson.      Lives:  With husband.      Employment:  Sales promotion account executive.        Tobacco: none      Alcohol: 4 glasses of wine weekly.      Drugs:none      Exercise: yes; walking and running   Family History  Problem Relation Age of Onset  . Heart disease Mother 27    cardiac stenting/CAD  . Hypertension Mother   . Cancer Mother     unknown  . Diabetes Mother   . Stroke Father   . Alcohol abuse Father   . Arthritis Brother         Objective:    BP 131/74  Pulse 65  Temp(Src) 98.6 F (37 C) (Oral)  Resp 16  Ht 5' 2.5" (1.588 m)  Wt 124 lb 6.4 oz (56.427 kg)  BMI 22.38 kg/m2  SpO2 97%  Physical Exam  Nursing note  and vitals reviewed. Constitutional: She is oriented to person, place, and time. She appears well-developed and well-nourished. No distress.  HENT:  Head: Normocephalic and atraumatic.  Right Ear: Hearing, tympanic membrane, external ear and ear canal normal.  Left Ear: Hearing, tympanic membrane, external ear and ear canal normal.  Nose: Nose normal.  Mouth/Throat: Oropharynx is clear and moist. No oropharyngeal exudate.  Eyes: Conjunctivae and EOM are normal. Pupils are equal, round, and reactive to light.  Neck: Normal range of motion. Neck supple. No thyromegaly present.  Cardiovascular: Normal rate, regular rhythm, normal heart sounds and intact distal pulses.  Exam reveals no gallop and no friction rub.   No murmur heard. Pulmonary/Chest: Effort normal and breath sounds normal. No respiratory distress. She has no wheezes. She has no rales. She exhibits no tenderness.  Abdominal: Soft. Bowel sounds are normal. She exhibits no distension and no mass. There is no tenderness. There is no rebound and no guarding.  Genitourinary: Rectum normal, vagina normal and uterus normal. No breast swelling, tenderness, discharge or bleeding. There is no rash, tenderness or lesion on the right labia. There is no rash, tenderness or lesion on the left labia. Cervix exhibits no motion tenderness, no discharge and no friability. Right adnexum displays no mass, no tenderness and no fullness. Left adnexum displays no mass, no tenderness and no fullness.  Musculoskeletal: Normal range of motion. She exhibits no edema and no tenderness.  Lymphadenopathy:    She has no cervical adenopathy.  Neurological: She is alert and oriented to person, place, and time. She has normal reflexes. No cranial nerve deficit. She exhibits abnormal muscle tone. Coordination normal.  Skin: Skin is warm and dry. No rash noted. She is not diaphoretic.  Psychiatric: She has a normal mood and affect. Her behavior is normal. Judgment and  thought content normal.       Assessment & Plan:  Routine general medical examination at a health care facility - Plan: POCT urinalysis dipstick, CBC with Differential, COMPLETE METABOLIC PANEL WITH GFR, Hemoglobin A1c, Lipid panel, TSH, EKG 12-Lead  Screening for breast cancer - Plan: MM DIGITAL SCREENING BILATERAL  Colon cancer screening - Plan: IFOBT POC (occult bld, rslt in office)  Need for Tdap vaccination - Plan: Tdap vaccine greater than or equal to 7yo IM  1.  Complete physical Exam:  Anticipatory guidance ---  ASA 81mg  daily, 3 servings of dairy daily.  Pap smear UTD in 2013; obtain records from [place].  Refer for mammogram.  Obtain hemosure; pt to schedule colonoscopy.  S/p TDAP in office; recommend checking with insurance regarding Zostavax coverage.  Obtain labs. 2.  Screening for breast cancer: refer for mammogram. 3.  Screening for colon cancer: obtain hemosure; pt to repeat colonoscopy this year. 4.  S/p TDAP. 5. Anxiety: stable on Lexapro and PRN Xanax; follow-up six months.  No orders of the defined types were placed in this encounter.    No Follow-up on file.    I personally performed the services described in this documentation, which was scribed in my presence.  The recorded information has been reviewed and is accurate.  Reginia Forts, M.D.  [facility] [address] [place], [place]  [address] [phone] phone [phone] fax

## 2013-12-11 NOTE — Patient Instructions (Signed)
1.  Call insurance regarding Shingles vaccine and coverage. 2.  Recommend Aspirin 81mg  one tablet daily for stroke and heart attack prevention. 3.  Recommend 3 servings of dairy daily for bone health.

## 2013-12-11 NOTE — Progress Notes (Signed)
   Subjective:    Patient ID: Tina Bray, female    DOB: 1951/06/16, 63 y.o.   MRN: [id number]  HPI    Review of Systems  Constitutional: Negative.   HENT: Positive for rhinorrhea, sinus pressure and sneezing.   Eyes: Negative.   Respiratory: Negative.   Cardiovascular: Negative.   Gastrointestinal: Negative.   Endocrine: Negative.   Genitourinary: Positive for dyspareunia.  Musculoskeletal: Positive for arthralgias.  Skin: Negative.   Allergic/Immunologic: Positive for environmental allergies.  Neurological: Negative.   Hematological: Negative.   Psychiatric/Behavioral: Negative.        Objective:   Physical Exam        Assessment & Plan:

## 2013-12-12 LAB — COMPLETE METABOLIC PANEL WITH GFR
ALT: 15 U/L (ref 0–35)
AST: 17 U/L (ref 0–37)
Albumin: 4.4 g/dL (ref 3.5–5.2)
Alkaline Phosphatase: 56 U/L (ref 39–117)
BILIRUBIN TOTAL: 0.6 mg/dL (ref 0.2–1.2)
BUN: 24 mg/dL — AB (ref 6–23)
CHLORIDE: 104 meq/L (ref 96–112)
CO2: 29 meq/L (ref 19–32)
Calcium: 9.8 mg/dL (ref 8.4–10.5)
Creat: 0.73 mg/dL (ref 0.50–1.10)
GFR, Est Non African American: 87 mL/min
Glucose, Bld: 83 mg/dL (ref 70–99)
POTASSIUM: 4.5 meq/L (ref 3.5–5.3)
SODIUM: 140 meq/L (ref 135–145)
TOTAL PROTEIN: 6.3 g/dL (ref 6.0–8.3)

## 2013-12-12 LAB — LIPID PANEL
Cholesterol: 192 mg/dL (ref 0–200)
HDL: 68 mg/dL (ref >39)
LDL CALC: 110 mg/dL — AB (ref 0–99)
TRIGLYCERIDES: 69 mg/dL (ref <150)
Total CHOL/HDL Ratio: 2.8 Ratio
VLDL: 14 mg/dL (ref 0–40)

## 2013-12-12 LAB — HEMOGLOBIN A1C
HEMOGLOBIN A1C: 5.4 % (ref <5.7)
MEAN PLASMA GLUCOSE: 108 mg/dL (ref <117)

## 2013-12-12 LAB — TSH: TSH: 1.736 u[IU]/mL (ref 0.350–4.500)

## 2013-12-13 ENCOUNTER — Encounter: Payer: Self-pay | Admitting: Family Medicine

## 2014-01-09 ENCOUNTER — Ambulatory Visit
Admission: RE | Admit: 2014-01-09 | Discharge: 2014-01-09 | Disposition: A | Discharge status: Home / Self Care | Payer: BC Managed Care – PPO | Source: Ambulatory Visit | Attending: Family Medicine | Admitting: Family Medicine

## 2014-01-09 ENCOUNTER — Encounter (INDEPENDENT_AMBULATORY_CARE_PROVIDER_SITE_OTHER): Payer: Self-pay

## 2014-01-09 DIAGNOSIS — Z1239 Encounter for other screening for malignant neoplasm of breast: Secondary | ICD-10-CM

## 2014-11-05 ENCOUNTER — Other Ambulatory Visit: Payer: Self-pay

## 2014-11-05 DIAGNOSIS — Z7189 Other specified counseling: Secondary | ICD-10-CM

## 2014-11-05 DIAGNOSIS — F419 Anxiety disorder, unspecified: Secondary | ICD-10-CM

## 2014-11-05 NOTE — Telephone Encounter (Signed)
Pharm reqs RF of xanax. Dr Tamala Julian you are listed as pt's PCP and saw her last/discussed this med 12/11/13. I don't think you have Rxd it for pt bf though. Do you want to RF? You wanted to see pt back in 6 mos. Pended 1 RF w/note to RTC.

## 2014-11-07 ENCOUNTER — Other Ambulatory Visit: Payer: Self-pay

## 2014-11-07 DIAGNOSIS — F419 Anxiety disorder, unspecified: Secondary | ICD-10-CM

## 2014-11-07 DIAGNOSIS — Z7189 Other specified counseling: Secondary | ICD-10-CM

## 2014-11-07 MED ORDER — ALPRAZOLAM 0.5 MG PO TABS: 0.5000 mg | ORAL_TABLET | Freq: Two times a day (BID) | ORAL | Status: DC | PRN

## 2014-11-07 NOTE — Telephone Encounter (Signed)
Pharm reqs RF of alprazolam. Pended.

## 2014-11-07 NOTE — Telephone Encounter (Signed)
Called in.

## 2014-11-12 ENCOUNTER — Ambulatory Visit (INDEPENDENT_AMBULATORY_CARE_PROVIDER_SITE_OTHER): Payer: Commercial Managed Care - HMO | Admitting: Family Medicine

## 2014-11-12 VITALS — BP 124/81 | HR 72 | Temp 98.7°F | Resp 18 | Wt 123.0 lb

## 2014-11-12 DIAGNOSIS — N898 Other specified noninflammatory disorders of vagina: Secondary | ICD-10-CM

## 2014-11-12 DIAGNOSIS — F419 Anxiety disorder, unspecified: Secondary | ICD-10-CM | POA: Diagnosis not present

## 2014-11-12 DIAGNOSIS — F329 Major depressive disorder, single episode, unspecified: Secondary | ICD-10-CM

## 2014-11-12 DIAGNOSIS — Z7189 Other specified counseling: Secondary | ICD-10-CM

## 2014-11-12 DIAGNOSIS — F32A Depression, unspecified: Secondary | ICD-10-CM

## 2014-11-12 MED ORDER — ALPRAZOLAM 0.5 MG PO TABS: 0.5000 mg | ORAL_TABLET | Freq: Two times a day (BID) | ORAL | Status: DC | PRN

## 2014-11-12 MED ORDER — ESCITALOPRAM OXALATE 20 MG PO TABS: 20.0000 mg | ORAL_TABLET | Freq: Every day | ORAL | Status: DC

## 2014-11-12 MED ORDER — ESTRADIOL 0.1 MG/GM VA CREA
TOPICAL_CREAM | VAGINAL | Status: DC
Start: 2014-11-12 — End: 2020-01-01

## 2014-11-12 MED ORDER — ESTRADIOL 0.1 MG/GM VA CREA
TOPICAL_CREAM | VAGINAL | Status: DC
Start: 2014-11-12 — End: 2014-11-12

## 2014-11-12 NOTE — Patient Instructions (Signed)
Good to see you today- let us know if you have any concerns  [facility] Policy for Prescribing Controlled Substances (Revised 07/2012) 1. Prescriptions for controlled substances will be filled by ONE provider at [facility] with whom you have established and developed a plan for your care, including follow-up. 2. You are encouraged to schedule an appointment with your prescriber at our appointment center for follow-up visits whenever possible. 3. If you request a prescription for the controlled substance while at [facility] for an acute problem (with someone other than your regular prescriber), you MAY be given a ONE-TIME prescription for a 30-day supply of the controlled substance, to allow time for you to return to see your regular prescriber for additional prescriptions You might also consider dropping to 10 mg of lexapro over the next year or so.  Take care!

## 2014-11-12 NOTE — Progress Notes (Signed)
[facility] [address] [place] [address] [phone]  Date:  11/12/2014   Name:  Tina Bray   DOB:  Jan 18, 1951   MRN:  [id number]  PCP:  Reginia Forts, MD    Chief Complaint: Medication Refill   History of Present Illness:  Tina Bray is a 64 y.o. very pleasant female patient who presents with the following:  Here today for medication refills. She needs her lexapro, xanax and estradiol.  She is on lexapro 20 mg- she feels that she is doing well with this, and that her mood is good. She has been on this for years and does not have any desire to stop using it.  Advised that the max dose of lexapro for "elderly" patients is 10 mg; she states that she actually takes just 1/2 of her lexapro pill generally anyway  She is using estradiol cream and feels that it helps her feel more comfortable- this helps her be comfortable with sex.   She uses her xanax as needed- she may use it a few times a month.  Although she does not need it often it does help her when she uses it. She is not really sure who her PCP is but I advised her to pick one provider to rx her controlled substances.   Patient Active Problem List   Diagnosis Date Noted  . Anxiety 11/14/2011  . GERD (gastroesophageal reflux disease) 11/14/2011  . Allergic rhinitis 11/14/2011    Past Medical History  Diagnosis Date  . Depression   . Asthma   . Anxiety   . Osteopenia   . Allergy     Past Surgical History  Procedure Laterality Date  . Appendectomy    . Cholecystectomy    . Cosmetic surgery    . Tubal ligation    . Carpal tunnel release    . Orthoscopic shoulder      right and left shoulder  . Colonoscopy  09/05/1998    normal; repeat in ten years.  . Tonsillectomy    . Spine surgery      cervical spine C5-6    History  Substance Use Topics  . Smoking status: Never Smoker   . Smokeless tobacco: Never Used  . Alcohol Use: 2.4 oz/week    4 Glasses of wine per  week    Family History  Problem Relation Age of Onset  . Heart disease Mother 35    cardiac stenting/CAD  . Hypertension Mother   . Cancer Mother     unknown  . Diabetes Mother   . Stroke Father   . Alcohol abuse Father   . Arthritis Brother     Allergies  Allergen Reactions  . Erythromycin Anaphylaxis  . Demerol Other (See Comments)    blisters  . Penicillins Hives  . Sulfa Antibiotics Hives    Medication list has been reviewed and updated.  Current Outpatient Prescriptions on File Prior to Visit  Medication Sig Dispense Refill  . ALPRAZolam (XANAX) 0.5 MG tablet Take 1 tablet (0.5 mg total) by mouth 2 (two) times daily as needed. 60 tablet 3  . b complex vitamins tablet Take 1 tablet by mouth daily.    Marland Kitchen escitalopram (LEXAPRO) 20 MG tablet Take 1 tablet (20 mg total) by mouth daily. 90 tablet 3  . estradiol (ESTRACE) 0.1 MG/GM vaginal cream Place 0.2 Applicatorfuls vaginally once a week. 2 times a week    . cholecalciferol (VITAMIN D) 1000 UNITS tablet Take  1,000 Units by mouth daily.     No current facility-administered medications on file prior to visit.    Review of Systems:  As per HPI- otherwise negative.   Physical Examination: Filed Vitals:   11/12/14 0815  BP: 124/81  Pulse: 72  Temp: 98.7 F (37.1 C)  Resp: 18   Filed Vitals:   11/12/14 0815  Weight: 123 lb (55.792 kg)   Body mass index is 22.12 kg/(m^2). Ideal Body Weight:    GEN: WDWN, NAD, Non-toxic, A & O x 3, looks well, normal weight HEENT: Atraumatic, Normocephalic. Neck supple. No masses, No LAD. Ears and Nose: No external deformity. CV: RRR, No M/G/R. No JVD. No thrill. No extra heart sounds. PULM: CTA B, no wheezes, crackles, rhonchi. No retractions. No resp. distress. No accessory muscle use. EXTR: No c/c/e NEURO Normal gait.  PSYCH: Normally interactive. Conversant. Not depressed or anxious appearing.  Calm demeanor.    Assessment and Plan: Depressive state - Plan:  escitalopram (LEXAPRO) 20 MG tablet  Anxiety - Plan: ALPRAZolam (XANAX) 0.5 MG tablet  Encounter for medication review and counseling - Plan: ALPRAZolam (XANAX) 0.5 MG tablet  Vaginal dryness - Plan: estradiol (ESTRACE) 0.1 MG/GM vaginal cream, DISCONTINUED: estradiol (ESTRACE) 0.1 MG/GM vaginal cream  Doing well with her medications.  Did refills as above, gave her a copy of our controlled substance policy for her review.    Signed Lamar Blinks, MD

## 2015-01-08 ENCOUNTER — Ambulatory Visit (INDEPENDENT_AMBULATORY_CARE_PROVIDER_SITE_OTHER): Payer: Commercial Managed Care - HMO | Admitting: Emergency Medicine

## 2015-01-08 VITALS — BP 132/80 | HR 62 | Temp 98.1°F | Ht 62.0 in | Wt 125.1 lb

## 2015-01-08 DIAGNOSIS — J302 Other seasonal allergic rhinitis: Secondary | ICD-10-CM

## 2015-01-08 MED ORDER — PSEUDOEPHEDRINE-GUAIFENESIN ER 60-600 MG PO TB12: 1.0000 | ORAL_TABLET | Freq: Two times a day (BID) | ORAL | Status: DC

## 2015-01-08 MED ORDER — TRIAMCINOLONE ACETONIDE 55 MCG/ACT NA AERO: 2.0000 | INHALATION_SPRAY | Freq: Every day | NASAL | Status: DC

## 2015-01-08 NOTE — Patient Instructions (Signed)

## 2015-01-08 NOTE — Progress Notes (Signed)
[facility] [address], [place] [address] [phone]  Date:  01/08/2015   Name:  Tina Bray   DOB:  April 14, 1951   MRN:  [id number]  PCP:  Reginia Forts, MD    Chief Complaint: Sinus Problem   History of Present Illness:  Tina Bray is a 64 y.o. very pleasant female patient who presents with the following:  Patient has nasal congestion and facial pressure and headache over the past month Has mucoid nasal and postnasal drainage No fever or chills Cough mostly not productive.  Worse at night and then a mucoid sputum No wheezing or shortness of breath. No nausea or vomiting. No stool change No rash No improvement with over the counter medications or other home remedies.  Denies other complaint or health concern today.   Patient Active Problem List   Diagnosis Date Noted  . Anxiety 11/14/2011  . GERD (gastroesophageal reflux disease) 11/14/2011  . Allergic rhinitis 11/14/2011    Past Medical History  Diagnosis Date  . Depression   . Asthma   . Anxiety   . Osteopenia   . Allergy     Past Surgical History  Procedure Laterality Date  . Appendectomy    . Cholecystectomy    . Cosmetic surgery    . Tubal ligation    . Carpal tunnel release    . Orthoscopic shoulder      right and left shoulder  . Colonoscopy  09/05/1998    normal; repeat in ten years.  . Tonsillectomy    . Spine surgery      cervical spine C5-6    History  Substance Use Topics  . Smoking status: Never Smoker   . Smokeless tobacco: Never Used  . Alcohol Use: 2.4 oz/week    4 Glasses of wine per week    Family History  Problem Relation Age of Onset  . Heart disease Mother 66    cardiac stenting/CAD  . Hypertension Mother   . Cancer Mother     unknown  . Diabetes Mother   . Stroke Father   . Alcohol abuse Father   . Arthritis Brother     Allergies  Allergen Reactions  . Erythromycin Anaphylaxis  . Demerol Other (See Comments)   blisters  . Penicillins Hives  . Sulfa Antibiotics Hives    Medication list has been reviewed and updated.  Current Outpatient Prescriptions on File Prior to Visit  Medication Sig Dispense Refill  . ALPRAZolam (XANAX) 0.5 MG tablet Take 1 tablet (0.5 mg total) by mouth 2 (two) times daily as needed. 60 tablet 0  . b complex vitamins tablet Take 1 tablet by mouth daily.    . cholecalciferol (VITAMIN D) 1000 UNITS tablet Take 1,000 Units by mouth daily.    Marland Kitchen escitalopram (LEXAPRO) 20 MG tablet Take 1 tablet (20 mg total) by mouth daily. 90 tablet 3  . estradiol (ESTRACE) 0.1 MG/GM vaginal cream Place 0.2 applicator vaginally twice a week 42.5 g 11  . ranitidine (ZANTAC) 150 MG tablet Take 150 mg by mouth 2 (two) times daily.     No current facility-administered medications on file prior to visit.    Review of Systems:  Review of Systems  Constitutional: Negative for fever, chills and fatigue.  HENT: Negative for congestion, ear pain, hearing loss, postnasal drip, rhinorrhea and sinus pressure.   Eyes: Negative for discharge and redness.  Respiratory: Negative for cough, shortness of breath and wheezing.  Cardiovascular: Negative for chest pain and leg swelling.  Gastrointestinal: Negative for nausea, vomiting, abdominal pain, constipation and blood in stool.  Genitourinary: Negative for dysuria, urgency and frequency.  Musculoskeletal: Negative for neck stiffness.  Skin: Negative for rash.  Neurological: Negative for seizures, weakness and headaches.   Physical Examination: Filed Vitals:   01/08/15 1918  BP: 132/80  Pulse: 62  Temp: 98.1 F (36.7 C)   Filed Vitals:   01/08/15 1918  Height: 5\' 2"  (1.575 m)  Weight: 125 lb 2 oz (56.756 kg)   Body mass index is 22.88 kg/(m^2). Ideal Body Weight: Weight in (lb) to have BMI = 25: 136.4  GEN: WDWN, NAD, Non-toxic, A & O x 3 HEENT: Atraumatic, Normocephalic. Neck supple. No masses, No LAD. Ears and Nose: No external  deformity.  Drums retracted bilaterally CV: RRR, No M/G/R. No JVD. No thrill. No extra heart sounds. PULM: CTA B, no wheezes, crackles, rhonchi. No retractions. No resp. distress. No accessory muscle use. ABD: S, NT, ND, +BS. No rebound. No HSM. EXTR: No c/c/e NEURO Normal gait.  PSYCH: Normally interactive. Conversant. Not depressed or anxious appearing.  Calm demeanor.    Assessment and Plan: Seasonal allergic rhinitis Eustachian dysfunction nasacort Allegra  mucinex d   Signed Ellison Carwin, MD

## 2015-06-04 ENCOUNTER — Ambulatory Visit (INDEPENDENT_AMBULATORY_CARE_PROVIDER_SITE_OTHER): Payer: Commercial Managed Care - HMO | Admitting: Physician Assistant

## 2015-06-04 ENCOUNTER — Other Ambulatory Visit: Payer: Self-pay

## 2015-06-04 VITALS — BP 160/78 | HR 53 | Temp 97.9°F | Resp 16 | Ht 62.0 in | Wt 123.0 lb

## 2015-06-04 DIAGNOSIS — L719 Rosacea, unspecified: Secondary | ICD-10-CM

## 2015-06-04 DIAGNOSIS — Z1211 Encounter for screening for malignant neoplasm of colon: Secondary | ICD-10-CM | POA: Diagnosis not present

## 2015-06-04 DIAGNOSIS — R197 Diarrhea, unspecified: Secondary | ICD-10-CM

## 2015-06-04 DIAGNOSIS — K580 Irritable bowel syndrome with diarrhea: Secondary | ICD-10-CM

## 2015-06-04 DIAGNOSIS — R030 Elevated blood-pressure reading, without diagnosis of hypertension: Secondary | ICD-10-CM | POA: Diagnosis not present

## 2015-06-04 DIAGNOSIS — R1084 Generalized abdominal pain: Secondary | ICD-10-CM

## 2015-06-04 DIAGNOSIS — Z23 Encounter for immunization: Secondary | ICD-10-CM | POA: Diagnosis not present

## 2015-06-04 DIAGNOSIS — X32XXXA Exposure to sunlight, initial encounter: Secondary | ICD-10-CM

## 2015-06-04 DIAGNOSIS — IMO0001 Reserved for inherently not codable concepts without codable children: Secondary | ICD-10-CM

## 2015-06-04 DIAGNOSIS — Z9189 Other specified personal risk factors, not elsewhere classified: Secondary | ICD-10-CM

## 2015-06-04 LAB — POC MICROSCOPIC URINALYSIS (UMFC): Mucus: ABSENT

## 2015-06-04 LAB — POCT CBC
Granulocyte percent: 64.3 %G (ref 37–80)
HEMATOCRIT: 43.2 % (ref 37.7–47.9)
Hemoglobin: 14 g/dL (ref 12.2–16.2)
Lymph, poc: 1.3 (ref 0.6–3.4)
MCH, POC: 29.2 pg (ref 27–31.2)
MCHC: 32.4 g/dL (ref 31.8–35.4)
MCV: 90.4 fL (ref 80–97)
MID (cbc): 0.2 (ref 0–0.9)
MPV: 7.7 fL (ref 0–99.8)
PLATELET COUNT, POC: 207 10*3/uL (ref 142–424)
POC Granulocyte: 2.6 (ref 2–6.9)
POC LYMPH %: 31.4 % (ref 10–50)
POC MID %: 4.3 %M (ref 0–12)
RBC: 4.78 M/uL (ref 4.04–5.48)
RDW, POC: 12.9 %
WBC: 4.1 10*3/uL — AB (ref 4.6–10.2)

## 2015-06-04 LAB — COMPLETE METABOLIC PANEL WITH GFR
ALBUMIN: 4.4 g/dL (ref 3.6–5.1)
ALT: 14 U/L (ref 6–29)
AST: 17 U/L (ref 10–35)
Alkaline Phosphatase: 56 U/L (ref 33–130)
BILIRUBIN TOTAL: 0.6 mg/dL (ref 0.2–1.2)
BUN: 14 mg/dL (ref 7–25)
CALCIUM: 9.8 mg/dL (ref 8.6–10.4)
CO2: 29 mmol/L (ref 20–31)
Chloride: 104 mmol/L (ref 98–110)
Creat: 0.68 mg/dL (ref 0.50–0.99)
GFR, Est African American: >89 mL/min (ref >=60)
GFR, Est Non African American: >89 mL/min (ref >=60)
Glucose, Bld: 77 mg/dL (ref 65–99)
POTASSIUM: 4 mmol/L (ref 3.5–5.3)
Sodium: 141 mmol/L (ref 135–146)
TOTAL PROTEIN: 6.3 g/dL (ref 6.1–8.1)

## 2015-06-04 LAB — POCT URINALYSIS DIP (MANUAL ENTRY)
Bilirubin, UA: NEGATIVE
Blood, UA: NEGATIVE
Glucose, UA: NEGATIVE
Ketones, POC UA: NEGATIVE
NITRITE UA: NEGATIVE
PH UA: 6
PROTEIN UA: NEGATIVE
Spec Grav, UA: <=1.005
UROBILINOGEN UA: 0.2

## 2015-06-04 LAB — LIPASE: Lipase: 30 U/L (ref 7–60)

## 2015-06-04 MED ORDER — LOPERAMIDE HCL 2 MG PO CAPS: 2.0000 mg | ORAL_CAPSULE | ORAL | Status: DC | PRN

## 2015-06-04 NOTE — Progress Notes (Signed)
[facility] [address], [place] [address] [phone]  Date:  06/04/2015   Name:  Tina Bray   DOB:  1951-06-05   MRN:  [id number]  PCP:  Reginia Forts, MD    History of Present Illness:  Tina Bray is a 64 y.o. female patient who present to [facility]    Patient is here today with mid-abdominal pain that has been present for about 2 months.  There is nausea, dairrhea, belching, and flatulence present.   Patient states that she has a dx of IBS from gastroenterologist years ago in [place].  She states that this can occur after eating or any moment of the day.  She has noticed less appetite.  She has attempted to use protonix for her pain without help.  She has had no heartburn, regurge, or sour brash.   She has noticed increased stress with caring for an ailing mother with stage 4 copd, and lung cancer.  She feels stresseed but denies depressive symptoms.   There is no blood in stool.  No frequency, dysuria, or hematuria. She has no abnormal vaginal discharge, pelvic cramping, or abnormal vaginal bleeding. She has seen a therapist years ago, but has not attempted at this time.  Last colonoscopy was at 60.  She never rechecked, stating that they would not allow for her to meet to doctor prior to the procedure, which was off putting. BP is elevated today.  She denies hx of this.  No chest pains, palpitations, leg swelling, or sob.  Exercises 3x per day. Would also like a dermatology referral for her dx of rosacea, as well as a routine check up.  She spends a great deal of time outdoors gardening 1 acres.  Following her stress, she also noticed that she began to break out around her mouth.  She has been applying dermidoctor, which has helped.      Patient Active Problem List   Diagnosis Date Noted  . Anxiety 11/14/2011  . GERD (gastroesophageal reflux disease) 11/14/2011  . Allergic rhinitis 11/14/2011    Past Medical History  Diagnosis  Date  . Depression   . Asthma   . Anxiety   . Osteopenia   . Allergy     Past Surgical History  Procedure Laterality Date  . Appendectomy    . Cholecystectomy    . Cosmetic surgery    . Tubal ligation    . Carpal tunnel release    . Orthoscopic shoulder      right and left shoulder  . Colonoscopy  09/05/1998    normal; repeat in ten years.  . Tonsillectomy    . Spine surgery      cervical spine C5-6    Social History  Substance Use Topics  . Smoking status: Never Smoker   . Smokeless tobacco: Never Used  . Alcohol Use: 2.4 oz/week    4 Glasses of wine per week    Family History  Problem Relation Age of Onset  . Heart disease Mother 78    cardiac stenting/CAD  . Hypertension Mother   . Cancer Mother     unknown  . Diabetes Mother   . Stroke Father   . Alcohol abuse Father   . Arthritis Brother     Allergies  Allergen Reactions  . Erythromycin Anaphylaxis  . Demerol Other (See Comments)    blisters  . Penicillins Hives  . Sulfa Antibiotics Hives    Medication list has been  reviewed and updated.  Current Outpatient Prescriptions on File Prior to Visit  Medication Sig Dispense Refill  . ALPRAZolam (XANAX) 0.5 MG tablet Take 1 tablet (0.5 mg total) by mouth 2 (two) times daily as needed. 60 tablet 0  . b complex vitamins tablet Take 1 tablet by mouth daily.    . cholecalciferol (VITAMIN D) 1000 UNITS tablet Take 1,000 Units by mouth daily.    Marland Kitchen escitalopram (LEXAPRO) 20 MG tablet Take 1 tablet (20 mg total) by mouth daily. 90 tablet 3  . estradiol (ESTRACE) 0.1 MG/GM vaginal cream Place 0.2 applicator vaginally twice a week 42.5 g 11  . fexofenadine (ALLEGRA) 180 MG tablet Take 180 mg by mouth daily.    . pseudoephedrine-guaifenesin (MUCINEX D) 60-600 MG per tablet Take 1 tablet by mouth every 12 (twelve) hours. (Patient not taking: Reported on 06/04/2015) 18 tablet 0  . ranitidine (ZANTAC) 150 MG tablet Take 150 mg by mouth 2 (two) times daily.    Marland Kitchen  triamcinolone (NASACORT AQ) 55 MCG/ACT AERO nasal inhaler Place 2 sprays into the nose daily. (Patient not taking: Reported on 06/04/2015) 1 Inhaler 12   No current facility-administered medications on file prior to visit.    ROS ROS otherwise unremarkable unless listed above.   Physical Examination: BP 160/78 mmHg  Pulse 53  Temp(Src) 97.9 F (36.6 C) (Oral)  Resp 16  Ht 5\' 2"  (1.575 m)  Wt 123 lb (55.792 kg)  BMI 22.49 kg/m2  SpO2 99% Ideal Body Weight: Weight in (lb) to have BMI = 25: 136.4  Physical Exam  Constitutional: She is oriented to person, place, and time. She appears well-developed and well-nourished. No distress.  HENT:  Head: Normocephalic and atraumatic.  Right Ear: External ear normal.  Left Ear: External ear normal.  Eyes: Conjunctivae and EOM are normal. Pupils are equal, round, and reactive to light.  Cardiovascular: Normal rate.   Pulmonary/Chest: Effort normal. No respiratory distress.  Abdominal: Normal appearance. Bowel sounds are increased. There is no hepatosplenomegaly. There is no tenderness. There is no CVA tenderness, no tenderness at McBurney's point and negative Murphy's sign.  Neurological: She is alert and oriented to person, place, and time.  Skin: She is not diaphoretic.  Mildly erythematous dry epidermis around the mouth to the just inferior to nasal folds and surrounding chin.  Psychiatric: She has a normal mood and affect. Her behavior is normal.    Results for orders placed or performed in visit on 06/04/15  POCT CBC  Result Value Ref Range   WBC 4.1 (A) 4.6 - 10.2 K/uL   Lymph, poc 1.3 0.6 - 3.4   POC LYMPH PERCENT 31.4 10 - 50 %L   MID (cbc) 0.2 0 - 0.9   POC MID % 4.3 0 - 12 %M   POC Granulocyte 2.6 2 - 6.9   Granulocyte percent 64.3 37 - 80 %G   RBC 4.78 4.04 - 5.48 M/uL   Hemoglobin 14.0 12.2 - 16.2 g/dL   HCT, POC 43.2 37.7 - 47.9 %   MCV 90.4 80 - 97 fL   MCH, POC 29.2 27 - 31.2 pg   MCHC 32.4 31.8 - 35.4 g/dL   RDW,  POC 12.9 %   Platelet Count, POC 207 142 - 424 K/uL   MPV 7.7 0 - 99.8 fL  POCT urinalysis dipstick  Result Value Ref Range   Color, UA yellow yellow   Clarity, UA clear clear   Glucose, UA negative negative   Bilirubin,  UA negative negative   Ketones, POC UA negative negative   Spec Grav, UA <=1.005    Blood, UA negative negative   pH, UA 6.0    Protein Ur, POC negative negative   Urobilinogen, UA 0.2    Nitrite, UA Negative Negative   Leukocytes, UA Trace (A) Negative  POCT Microscopic Urinalysis ([facility])  Result Value Ref Range   WBC,UR,HPF,POC Few (A) None WBC/hpf   RBC,UR,HPF,POC None None RBC/hpf   Bacteria Few (A) None   Mucus Absent Absent   Epithelial Cells, UR Per Microscopy Few (A) None cells/hpf    Assessment and Plan: 64 yr old female with PMH listed above is here today for chief complaint of abdominal pain for 2 months, skin changes, and here with elevated bp. BP rechecked at 160/72.  There is an anxiety component at this time.  Asymptomatic, I have advised that she rtn after 2 bp rechecks of over 140/90.  She agrees.  -GI consult appreciated at this time.  Confirmation of IBS, and she needs to update her colonoscopy screening.  Starting her on loperamide with instructiion of use.  FODMAP diet given. -Referral to dermatologist. -Flu vaccine given today.  Generalized abdominal pain - Plan: POCT CBC, POCT urinalysis dipstick, POCT Microscopic Urinalysis ([facility]), Lipase, COMPLETE METABOLIC PANEL WITH GFR, Ambulatory referral to Gastroenterology  Diarrhea - Plan: POCT CBC, COMPLETE METABOLIC PANEL WITH GFR, Ambulatory referral to Gastroenterology  Rosacea - Plan: Ambulatory referral to Dermatology  Need for prophylactic vaccination and inoculation against influenza - Plan: Flu Vaccine QUAD 36+ mos IM  Excess sun exposure - Plan: Ambulatory referral to Dermatology  Screening for colon cancer - Plan: Ambulatory referral to Gastroenterology  Irritable bowel syndrome  with diarrhea - Plan: DISCONTINUED: loperamide (IMODIUM) 2 MG capsule  Elevated BP  Ivar Drape, PA-C [facility] 06/04/2015 12:06 PM

## 2015-06-04 NOTE — Patient Instructions (Signed)
Please use the loperamide 2mg  after a loose stool.   Also try taking a pro-biotic daily.   Please await contact for appointments for dermatology and gastroenterology.  If you ever decide to utilize counseling at this time, please let me know.   Diet and Irritable Bowel Syndrome  No cure has been found for irritable bowel syndrome (IBS). Many options are available to treat the symptoms. Your caregiver will give you the best treatments available for your symptoms. He or she will also encourage you to manage stress and to make changes to your diet. You need to work with your caregiver and Registered Dietician to find the best combination of medicine, diet, counseling, and support to control your symptoms. The following are some diet suggestions. FOODS THAT MAKE IBS WORSE  Fatty foods, such as [place] fries.  Milk products, such as cheese or ice cream.  Chocolate.  Alcohol.  Caffeine (found in coffee and some sodas).  Carbonated drinks, such as soda. If certain foods cause symptoms, you should eat less of them or stop eating them. FOOD JOURNAL   Keep a journal of the foods that seem to cause distress. Write down:  What you are eating during the day and when.  What problems you are having after eating.  When the symptoms occur in relation to your meals.  What foods always make you feel badly.  Take your notes with you to your caregiver to see if you should stop eating certain foods. FOODS THAT MAKE IBS BETTER Fiber reduces IBS symptoms, especially constipation, because it makes stools soft, bulky, and easier to pass. Fiber is found in bran, bread, cereal, beans, fruit, and vegetables. Examples of foods with fiber include:  Apples.  Peaches.  Pears.  Berries.  Figs.  Broccoli, raw.  Cabbage.  Carrots.  Raw peas.  Kidney beans.  Lima beans.  Whole-grain bread.  Whole-grain cereal. Add foods with fiber to your diet a little at a time. This will let your body get  used to them. Too much fiber at once might cause gas and swelling of your abdomen. This can trigger symptoms in a person with IBS. Caregivers usually recommend a diet with enough fiber to produce soft, painless bowel movements. High fiber diets may cause gas and bloating. However, these symptoms often go away within a few weeks, as your body adjusts. In many cases, dietary fiber may lessen IBS symptoms, particularly constipation. However, it may not help pain or diarrhea. High fiber diets keep the colon mildly enlarged (distended) with the added fiber. This may help prevent spasms in the colon. Some forms of fiber also keep water in the stool, thereby preventing hard stools that are difficult to pass.  Besides telling you to eat more foods with fiber, your caregiver may also tell you to get more fiber by taking a fiber pill or drinking water mixed with a special high fiber powder. An example of this is a natural fiber laxative containing psyllium seed.  TIPS  Large meals can cause cramping and diarrhea in people with IBS. If this happens to you, try eating 4 or 5 small meals a day, or try eating less at each of your usual 3 meals. It may also help if your meals are low in fat and high in carbohydrates. Examples of carbohydrates are pasta, rice, whole-grain breads and cereals, fruits, and vegetables.  If dairy products cause your symptoms to flare up, you can try eating less of those foods. You might be able to  handle yogurt better than other dairy products, because it contains bacteria that helps with digestion. Dairy products are an important source of calcium and other nutrients. If you need to avoid dairy products, be sure to talk with a Registered Dietitian about getting these nutrients through other food sources.  Drink enough water and fluids to keep your urine clear or pale yellow. This is important, especially if you have diarrhea. FOR MORE INFORMATION  International Foundation for Functional  Gastrointestinal Disorders: www.iffgd.org  National Digestive Diseases Information Clearinghouse: digestive.AmenCredit.is Document Released: 11/12/2003 Document Revised: 11/14/2011 Document Reviewed: 11/22/2013 [facility] Patient Information 2015 [place], [place]. This information is not intended to replace advice given to you by your health care provider. Make sure you discuss any questions you have with your health care provider.

## 2015-06-05 ENCOUNTER — Encounter: Payer: Self-pay | Admitting: Gastroenterology

## 2015-06-15 ENCOUNTER — Encounter: Payer: Self-pay | Admitting: Gastroenterology

## 2015-06-15 ENCOUNTER — Ambulatory Visit (INDEPENDENT_AMBULATORY_CARE_PROVIDER_SITE_OTHER): Payer: Commercial Managed Care - HMO | Admitting: Gastroenterology

## 2015-06-15 VITALS — BP 130/70 | HR 72 | Ht 62.5 in | Wt 125.0 lb

## 2015-06-15 DIAGNOSIS — K219 Gastro-esophageal reflux disease without esophagitis: Secondary | ICD-10-CM

## 2015-06-15 DIAGNOSIS — Z1211 Encounter for screening for malignant neoplasm of colon: Secondary | ICD-10-CM | POA: Diagnosis not present

## 2015-06-15 DIAGNOSIS — K589 Irritable bowel syndrome without diarrhea: Secondary | ICD-10-CM | POA: Diagnosis not present

## 2015-06-15 MED ORDER — PANTOPRAZOLE SODIUM 40 MG PO TBEC: 40.0000 mg | DELAYED_RELEASE_TABLET | Freq: Every day | ORAL | Status: DC

## 2015-06-15 MED ORDER — DICYCLOMINE HCL 10 MG PO CAPS: 10.0000 mg | ORAL_CAPSULE | Freq: Three times a day (TID) | ORAL | Status: DC

## 2015-06-15 NOTE — Patient Instructions (Signed)
We have sent the following medications to your pharmacy for you to pick up at your convenience:Bentyl and Protonix.   You will be due for a recall colonoscopy in 10/2015. We will send you a reminder in the mail when it gets closer to that time.  Your follow up appointment with Dr. Fuller Plan is on 08/04/15 at 2:15am.  Thank you for choosing me and [facility].  Pricilla Riffle. Dagoberto Ligas., MD., Marval Regal

## 2015-06-15 NOTE — Progress Notes (Signed)
    History of Present Illness: This is a 64 year old femalereferred by [name], MD for the evaluation of GERD and irritable bowel syndrome. She has a history of GERD and irritable bowel syndrome dating back 15-20 years. She was evaluated by Dr. Monika Salk in [place], [place] and treated for both disorders. She states she underwent colonoscopy in 2001 at age 19 and it was normal. She notes flares of her irritable bowel syndrome when she is under stress. She has episodes of constipation and other times with postprandial diarrhea associated with bloating and lower abdominal pain. She is taking care of her mother with lung cancer and advanced COPD and states she's been under more stress with more diarrhea generally occurring after meals. She has frequent problems with GERD with associated belching and typical reflux symptoms. She states her symptoms have been well controlled in the past when she takes pantoprazole. She is currently taking ranitidine without good control. Denies weight loss, change in stool caliber, melena, hematochezia, nausea, vomiting, dysphagia, chest pain.  Review of Systems: Pertinent positive and negative review of systems were noted in the above HPI section. All other review of systems were otherwise negative.  Current Medications, Allergies, Past Medical History, Past Surgical History, Family History and Social History were reviewed in [employer] record.  Physical Exam: General: Well developed, well nourished, no acute distress Head: Normocephalic and atraumatic Eyes:  sclerae anicteric, EOMI Ears: Normal auditory acuity Mouth: No deformity or lesions Neck: Supple, no masses or thyromegaly Lungs: Clear throughout to auscultation Heart: Regular rate and rhythm; no murmurs, rubs or bruits Abdomen: Soft, non tender and non distended. No masses, hepatosplenomegaly or hernias noted. Normal Bowel sounds Musculoskeletal: Symmetrical with no gross  deformities  Skin: No lesions on visible extremities Pulses:  Normal pulses noted Extremities: No clubbing, cyanosis, edema or deformities noted Neurological: Alert oriented x 4, grossly nonfocal Cervical Nodes:  No significant cervical adenopathy Inguinal Nodes: No significant inguinal adenopathy Psychological:  Alert and cooperative. Normal mood and affect  Assessment and Recommendations:  1. IBS, longstanding, alternating pattern. Bentyl 10 mg tid ac prn. Miralax qd prn constipation. Avoid foods that trigger symptoms. Imodium twice a day as needed for diarrhea that is not controlled with Bentyl and diet. REV in 6 weeks.   2. GERD. Protonix 40 mg po daily. Antireflux measures.   3. CRC screening, average risk. She is overdue for colonoscopy however she would like to defer this for a few months given her mother's health problems. Will place a colonoscopy recall for February 2017.   cc: [name], MD [address] [place], [place] [address]

## 2015-06-29 ENCOUNTER — Telehealth: Payer: Self-pay | Admitting: Family Medicine

## 2015-06-29 NOTE — Telephone Encounter (Signed)
Spoke with patient about rescheduling her appt that she had with Tamala Julian in Dec. For a CPE she will call us back to reschedule

## 2015-08-04 ENCOUNTER — Ambulatory Visit: Payer: Commercial Managed Care - HMO | Admitting: Gastroenterology

## 2015-09-04 ENCOUNTER — Encounter: Payer: Commercial Managed Care - HMO | Admitting: Family Medicine

## 2015-10-07 ENCOUNTER — Encounter: Payer: Self-pay | Admitting: Gastroenterology

## 2015-10-26 ENCOUNTER — Encounter: Payer: Self-pay | Admitting: Gastroenterology

## 2015-10-26 ENCOUNTER — Ambulatory Visit (INDEPENDENT_AMBULATORY_CARE_PROVIDER_SITE_OTHER): Payer: Medicare HMO | Admitting: Gastroenterology

## 2015-10-26 VITALS — BP 100/70 | HR 64 | Ht 62.0 in | Wt 126.1 lb

## 2015-10-26 DIAGNOSIS — Z1211 Encounter for screening for malignant neoplasm of colon: Secondary | ICD-10-CM

## 2015-10-26 DIAGNOSIS — K219 Gastro-esophageal reflux disease without esophagitis: Secondary | ICD-10-CM

## 2015-10-26 DIAGNOSIS — K589 Irritable bowel syndrome without diarrhea: Secondary | ICD-10-CM

## 2015-10-26 MED ORDER — HYOSCYAMINE SULFATE 0.125 MG SL SUBL: SUBLINGUAL_TABLET | SUBLINGUAL | Status: DC

## 2015-10-26 MED ORDER — NA SULFATE-K SULFATE-MG SULF 17.5-3.13-1.6 GM/177ML PO SOLN: 1.0000 | Freq: Once | ORAL | Status: DC

## 2015-10-26 NOTE — Patient Instructions (Signed)
Discontinue Bentyl.  We have sent the following medications to your pharmacy for you to pick up at your convenience:Levsin.  You have been scheduled for a colonoscopy. Please follow written instructions given to you at your visit today.  Please pick up your prep supplies at the pharmacy within the next 1-3 days. If you use inhalers (even only as needed), please bring them with you on the day of your procedure. Your physician has requested that you go to www.startemmi.com and enter the access code given to you at your visit today. This web site gives a general overview about your procedure. However, you should still follow specific instructions given to you by our office regarding your preparation for the procedure.  Thank you for choosing me and [facility].  Pricilla Riffle. Dagoberto Ligas., MD., Marval Regal

## 2015-10-26 NOTE — Progress Notes (Signed)
    History of Present Illness: This is a 65 year old female returning for follow-up of GERD and IBS. Her reflux symptoms are under very good control. Her irritable bowel syndrome is less active. She takes dicyclomine as needed. She notes problems with frequent postprandial gas. She states when she took dicyclomine on a regular basis she had dizziness. She takes 1 dicylcomine on occasion she does not note this side effects.  Current Medications, Allergies, Past Medical History, Past Surgical History, Family History and Social History were reviewed in [employer] record.  Physical Exam: General: Well developed, well nourished, no acute distress Head: Normocephalic and atraumatic Eyes:  sclerae anicteric, EOMI Ears: Normal auditory acuity Mouth: No deformity or lesions Lungs: Clear throughout to auscultation Heart: Regular rate and rhythm; no murmurs, rubs or bruits Abdomen: Soft, non tender and non distended. No masses, hepatosplenomegaly or hernias noted. Normal Bowel sounds Rectal: deferred to colonoscopy Musculoskeletal: Symmetrical with no gross deformities  Pulses:  Normal pulses noted Extremities: No clubbing, cyanosis, edema or deformities noted Neurological: Alert oriented x 4, grossly nonfocal Psychological:  Alert and cooperative. Normal mood and affect  Assessment and Recommendations:  1. IBS. Continue to avoid foods that trigger symptoms. Gas-X 4 times a day when necessary. DC dicyclomine. Trial of hyoscyamine 1-2 every 4 hours when necessary.  2. GERD. Continue standard antireflux measures and pantoprazole 40 mg daily.  3. CRC screening, average risk. Schedule colonoscopy. The risks (including bleeding, perforation, infection, missed lesions, medication reactions and possible hospitalization or surgery if complications occur), benefits, and alternatives to colonoscopy with possible biopsy and possible polypectomy were discussed with the patient and they  consent to proceed.

## 2015-11-03 DIAGNOSIS — Z01419 Encounter for gynecological examination (general) (routine) without abnormal findings: Secondary | ICD-10-CM | POA: Diagnosis not present

## 2015-11-03 DIAGNOSIS — Z13820 Encounter for screening for osteoporosis: Secondary | ICD-10-CM | POA: Diagnosis not present

## 2015-11-03 DIAGNOSIS — Z124 Encounter for screening for malignant neoplasm of cervix: Secondary | ICD-10-CM | POA: Diagnosis not present

## 2015-11-03 DIAGNOSIS — Z6822 Body mass index (BMI) 22.0-22.9, adult: Secondary | ICD-10-CM | POA: Diagnosis not present

## 2015-11-03 DIAGNOSIS — M8588 Other specified disorders of bone density and structure, other site: Secondary | ICD-10-CM | POA: Diagnosis not present

## 2015-11-03 DIAGNOSIS — N958 Other specified menopausal and perimenopausal disorders: Secondary | ICD-10-CM | POA: Diagnosis not present

## 2015-11-03 DIAGNOSIS — Z1231 Encounter for screening mammogram for malignant neoplasm of breast: Secondary | ICD-10-CM | POA: Diagnosis not present

## 2015-11-18 ENCOUNTER — Ambulatory Visit (INDEPENDENT_AMBULATORY_CARE_PROVIDER_SITE_OTHER): Payer: Medicare HMO | Admitting: Family Medicine

## 2015-11-18 ENCOUNTER — Encounter: Payer: Self-pay | Admitting: Family Medicine

## 2015-11-18 VITALS — BP 130/80 | HR 73 | Temp 98.6°F | Resp 16 | Ht 62.0 in | Wt 124.0 lb

## 2015-11-18 DIAGNOSIS — Z7189 Other specified counseling: Secondary | ICD-10-CM

## 2015-11-18 DIAGNOSIS — F419 Anxiety disorder, unspecified: Secondary | ICD-10-CM

## 2015-11-18 DIAGNOSIS — F329 Major depressive disorder, single episode, unspecified: Secondary | ICD-10-CM | POA: Diagnosis not present

## 2015-11-18 DIAGNOSIS — F32A Depression, unspecified: Secondary | ICD-10-CM

## 2015-11-18 MED ORDER — ALPRAZOLAM 0.5 MG PO TABS: 0.5000 mg | ORAL_TABLET | Freq: Two times a day (BID) | ORAL | Status: DC | PRN

## 2015-11-18 MED ORDER — ESCITALOPRAM OXALATE 20 MG PO TABS: 20.0000 mg | ORAL_TABLET | Freq: Every day | ORAL | Status: DC

## 2015-11-18 NOTE — Patient Instructions (Signed)
     IF you received an x-ray today, you will receive an invoice from [facility]. Please contact [facility] at [phone] with questions or concerns regarding your invoice.   IF you received labwork today, you will receive an invoice from [facility]. Please contact [facility] at [phone] with questions or concerns regarding your invoice.   Our billing staff will not be able to assist you with questions regarding bills from these companies.  You will be contacted with the lab results as soon as they are available. The fastest way to get your results is to activate your My Chart account. Instructions are located on the last page of this paperwork. If you have not heard from us regarding the results in 2 weeks, please contact this office.      

## 2015-11-18 NOTE — Progress Notes (Signed)
Patient ID: Tina Bray, female   DOB: [birth year], 65 y.o.   MRN: [id number]  By signing my name below, I, Essence Howell, attest that this documentation has been prepared under the direction and in the presence of Robyn Haber, MD Electronically Signed: Ladene Artist, ED Scribe 11/18/2015 at 1:20 PM.  Patient ID: Tina Bray MRN: [id number], DOB: 10-01-1950, 65 y.o. Date of Encounter: 11/18/2015, 1:20 PM  Primary Physician: Reginia Forts, MD  Chief Complaint:  Chief Complaint  Patient presents with  . Medication Refill    Lexapro 63m, Xanax 0.563m   HPI: [age].o. year old female with history below presents for a medication refill of Lexapro 20 mg and Xanax 0.5 mg.   Works at [employer] Married and [place] from [place] ago.  Doing well  Past Medical History  Diagnosis Date  . Depression   . Asthma   . Anxiety   . Osteopenia   . Allergy   . IBS (irritable bowel syndrome)   . Pneumonia   . Kidney stones   . Meningitis     Home Meds: Prior to Admission medications   Medication Sig Start Date End Date Taking? Authorizing Provider  ALPRAZolam (XDuanne Moron0.5 MG tablet Take 1 tablet (0.5 mg total) by mouth 2 (two) times daily as needed. 11/12/14   JeDarreld McleanMD  b complex vitamins tablet Take 1 tablet by mouth daily.    Historical Provider, MD  cholecalciferol (VITAMIN D) 1000 UNITS tablet Take 1,000 Units by mouth daily.    Historical Provider, MD  escitalopram (LEXAPRO) 20 MG tablet Take 1 tablet (20 mg total) by mouth daily. 11/12/14   JeGay Filleropland, MD  estradiol (ESTRACE) 0.1 MG/GM vaginal cream Place 0.2 applicator vaginally twice a week 11/12/14   JeGay Filleropland, MD  fexofenadine (ALLEGRA) 180 MG tablet Take 180 mg by mouth as needed.     Historical Provider, MD  hyoscyamine (LEVSIN SL) 0.125 MG SL tablet Take 1-2 tablets by mouth or under tongue every 4 hours as needed 10/26/15   MaLadene ArtistMD  Na Sulfate-K Sulfate-Mg Sulf  SOLN Take 1 kit by mouth once. 10/26/15   MaLadene ArtistMD  pantoprazole (PROTONIX) 40 MG tablet Take 1 tablet (40 mg total) by mouth daily. 06/15/15   MaLadene ArtistMD   Allergies:  Allergies  Allergen Reactions  . Erythromycin Anaphylaxis  . Demerol Other (See Comments)    blisters  . Penicillins Hives  . Sulfa Antibiotics Hives    Social History   Social History  . Marital Status: Married    Spouse Name: N/A  . Number of Children: 2  . Years of Education: N/A   Occupational History  . retired    Social History Main Topics  . Smoking status: Never Smoker   . Smokeless tobacco: Never Used  . Alcohol Use: 2.4 oz/week    4 Glasses of wine per week  . Drug Use: No  . Sexual Activity: Yes    Birth Control/ Protection: None   Other Topics Concern  . Not on file   Social History Narrative   Marital status:  Married x 14 years; happily married.  No abuse.  [place]oved from [place]     Children:  2 daughters; 1 grandson.      Lives:  With husband.      Employment:  [employer] account executive       Tobacco: none  Alcohol: 4 glasses of wine weekly.      Drugs:none      Exercise: yes; walking and running    Review of Systems: Constitutional: negative for chills, fever, night sweats, weight changes, or fatigue  HEENT: negative for vision changes, hearing loss, congestion, rhinorrhea, ST, epistaxis, or sinus pressure Cardiovascular: negative for chest pain or palpitations Respiratory: negative for hemoptysis, wheezing, shortness of breath, or cough Abdominal: negative for abdominal pain, nausea, vomiting, diarrhea, or constipation Dermatological: negative for rash Neurologic: negative for headache, dizziness, or syncope All other systems reviewed and are otherwise negative with the exception to those above and in the HPI.  Physical Exam: Blood pressure 130/80, pulse 73, temperature 98.6 F (37 C), temperature source Oral, resp. rate 16, height _0  (1.575 m),  weight 124 lb (56.246 kg), SpO2 96 %., Body mass index is 22.67 kg/(m^2). General: Well developed, well nourished, in no acute distress. Head: Normocephalic, atraumatic, eyes without discharge, sclera non-icteric, nares are without discharge. Bilateral auditory canals clear, TM's are without perforation, pearly grey and translucent with reflective cone of light bilaterally. Oral cavity moist, posterior pharynx without exudate, erythema, peritonsillar abscess, or post nasal drip.  Neck: Supple. No thyromegaly. Full ROM. No lymphadenopathy. Lungs: Clear bilaterally to auscultation without wheezes, rales, or rhonchi. Breathing is unlabored. Heart: RRR with S1 S2. No murmurs, rubs, or gallops appreciated. Abdomen: Soft, non-tender, non-distended with normoactive bowel sounds. No hepatomegaly. No rebound/guarding. No obvious abdominal masses. Msk:  Strength and tone normal for age. Extremities/Skin: Warm and dry. No clubbing or cyanosis. No edema. No rashes or suspicious lesions. Neuro: Alert and oriented X 3. Moves all extremities spontaneously. Gait is normal. CNII-XII grossly in tact. Psych:  Responds to questions appropriately with a normal affect.    ASSESSMENT AND PLAN:  65 y.o. year old female with  1. Depressive state   2. Anxiety   3. Encounter for medication review and counseling     This chart was scribed in my presence and reviewed by me personally.    ICD-9-CM ICD-10-CM   1. Depressive state 311 F32.9 escitalopram (LEXAPRO) 20 MG tablet  2. Anxiety 300.00 F41.9 ALPRAZolam (XANAX) 0.5 MG tablet  3. Encounter for medication review and counseling V65.49 Z71.89 ALPRAZolam (XANAX) 0.5 MG tablet    Signed, Robyn Haber, MD 11/18/2015 1:20 PM

## 2015-12-11 ENCOUNTER — Ambulatory Visit (AMBULATORY_SURGERY_CENTER): Payer: Medicare HMO | Admitting: Gastroenterology

## 2015-12-11 ENCOUNTER — Encounter: Payer: Self-pay | Admitting: Gastroenterology

## 2015-12-11 VITALS — BP 109/58 | HR 64 | Temp 98.0°F | Resp 14 | Ht 62.0 in | Wt 126.0 lb

## 2015-12-11 DIAGNOSIS — Z1211 Encounter for screening for malignant neoplasm of colon: Secondary | ICD-10-CM | POA: Diagnosis not present

## 2015-12-11 DIAGNOSIS — D125 Benign neoplasm of sigmoid colon: Secondary | ICD-10-CM

## 2015-12-11 DIAGNOSIS — R69 Illness, unspecified: Secondary | ICD-10-CM | POA: Diagnosis not present

## 2015-12-11 DIAGNOSIS — J45909 Unspecified asthma, uncomplicated: Secondary | ICD-10-CM | POA: Diagnosis not present

## 2015-12-11 MED ORDER — SODIUM CHLORIDE 0.9 % IV SOLN: 500.0000 mL | INTRAVENOUS | Status: DC

## 2015-12-11 NOTE — Patient Instructions (Signed)
YOU HAD AN ENDOSCOPIC PROCEDURE TODAY AT [facility]:   Refer to the procedure report that was given to you for any specific questions about what was found during the examination.  If the procedure report does not answer your questions, please call your gastroenterologist to clarify.  If you requested that your care partner not be given the details of your procedure findings, then the procedure report has been included in a sealed envelope for you to review at your convenience later.  YOU SHOULD EXPECT: Some feelings of bloating in the abdomen. Passage of more gas than usual.  Walking can help get rid of the air that was put into your GI tract during the procedure and reduce the bloating. If you had a lower endoscopy (such as a colonoscopy or flexible sigmoidoscopy) you may notice spotting of blood in your stool or on the toilet paper. If you underwent a bowel prep for your procedure, you may not have a normal bowel movement for a few days.  Please Note:  You might notice some irritation and congestion in your nose or some drainage.  This is from the oxygen used during your procedure.  There is no need for concern and it should clear up in a day or so.  SYMPTOMS TO REPORT IMMEDIATELY:   Following lower endoscopy (colonoscopy or flexible sigmoidoscopy):  Excessive amounts of blood in the stool  Significant tenderness or worsening of abdominal pains  Swelling of the abdomen that is new, acute  Fever of 100F or higher  For urgent or emergent issues, a gastroenterologist can be reached at any hour by calling [phone].   DIET: Your first meal following the procedure should be a small meal and then it is ok to progress to your normal diet. Heavy or fried foods are harder to digest and may make you feel nauseous or bloated.  Likewise, meals heavy in dairy and vegetables can increase bloating.  Drink plenty of fluids but you should avoid alcoholic beverages for 24  hours.  ACTIVITY:  You should plan to take it easy for the rest of today and you should NOT DRIVE or use heavy machinery until tomorrow (because of the sedation medicines used during the test).    FOLLOW UP: Our staff will call the number listed on your records the next business day following your procedure to check on you and address any questions or concerns that you may have regarding the information given to you following your procedure. If we do not reach you, we will leave a message.  However, if you are feeling well and you are not experiencing any problems, there is no need to return our call.  We will assume that you have returned to your regular daily activities without incident.  If any biopsies were taken you will be contacted by phone or by letter within the next 1-3 weeks.  Please call us at [phone] if you have not heard about the biopsies in 3 weeks.    SIGNATURES/CONFIDENTIALITY: You and/or your care partner have signed paperwork which will be entered into your electronic medical record.  These signatures attest to the fact that that the information above on your After Visit Summary has been reviewed and is understood.  Full responsibility of the confidentiality of this discharge information lies with you and/or your care-partner.  Please read all the handouts given to you by your recovery nurse. Thank you for letting us take care of your healthcare needs.

## 2015-12-11 NOTE — Progress Notes (Signed)
Called to room to assist during endoscopic procedure.  Patient ID and intended procedure confirmed with present staff. Received instructions for my participation in the procedure from the performing physician.  

## 2015-12-11 NOTE — Op Note (Signed)
[facility] Patient Name: Tina Bray Procedure Date: 12/11/2015 2:52 PM MRN: [id number] Endoscopist: Ladene Artist MD, MD Age: 65 Date of Birth: 1951-04-14 Gender: Female Procedure:                Colonoscopy Indications:              Screening for colorectal malignant neoplasm Medicines:                Monitored Anesthesia Care Procedure:                Pre-Anesthesia Assessment:                           - Prior to the procedure, a History and Physical                            was performed, and patient medications and                            allergies were reviewed. The patient's tolerance of                            previous anesthesia was also reviewed. The risks                            and benefits of the procedure and the sedation                            options and risks were discussed with the patient.                            All questions were answered, and informed consent                            was obtained. Prior Anticoagulants: The patient has                            taken no previous anticoagulant or antiplatelet                            agents. ASA Grade Assessment: II - A patient with                            mild systemic disease. After reviewing the risks                            and benefits, the patient was deemed in                            satisfactory condition to undergo the procedure.                           After obtaining informed consent, the colonoscope  was passed under direct vision. Throughout the                            procedure, the patient's blood pressure, pulse, and                            oxygen saturations were monitored continuously. The                            Model PCF-H190L [id number] scope was introduced                            through the anus and advanced to the the cecum,                            identified by appendiceal orifice and ileocecal                             valve. The colonoscopy was performed without                            difficulty. The patient tolerated the procedure                            well. The quality of the bowel preparation was                            excellent. The ileocecal valve, appendiceal                            orifice, and rectum were photographed. Scope In: 3:03:50 PM Scope Out: 3:18:56 PM Scope Withdrawal Time: 0 hours 12 minutes 4 seconds  Total Procedure Duration: 0 hours 15 minutes 6 seconds  Findings:                 The digital rectal exam was normal.                           A 5 mm polyp was found in the sigmoid colon. The                            polyp was sessile. The polyp was removed with a                            cold biopsy forceps. Resection and retrieval were                            complete.                           Multiple small-mouthed diverticula were found in                            the sigmoid colon.  The exam was otherwise normal throughout the                            examined colon.                           The retroflexed view of the distal rectum and anal                            verge was normal and showed no anal or rectal                            abnormalities. Complications:            No immediate complications. Estimated Blood Loss:     Estimated blood loss: none. Impression:               - One 5 mm polyp in the sigmoid colon, removed with                            a cold biopsy forceps. Resected and retrieved.                           - Diverticulosis in the sigmoid colon. Recommendation:           - Patient has a contact number available for                            emergencies. The signs and symptoms of potential                            delayed complications were discussed with the                            patient. Return to normal activities tomorrow.                            Written  discharge instructions were provided to the                            patient.                           - Resume previous diet.                           - Continue present medications.                           - Await pathology results.                           - Repeat colonoscopy in 5 years for surveillance if                            polyp is precancerous, otherwise 10 years. Ladene Artist MD, MD 12/11/2015 3:23:39  PM This report has been signed electronically.

## 2015-12-11 NOTE — Progress Notes (Signed)
To recovery, report to Monday, RN, VSS 

## 2015-12-14 ENCOUNTER — Telehealth: Payer: Self-pay

## 2015-12-14 NOTE — Telephone Encounter (Signed)
  Follow up Call-  Call back number 12/11/2015  Post procedure Call Back phone  # [phone]  Permission to leave phone message Yes    Patient was called for follow up after procedure on 12/11/2015. No answer at the number given for follow up phone call. A message was left on the answering machine.

## 2015-12-16 ENCOUNTER — Encounter: Payer: Self-pay | Admitting: Gastroenterology

## 2016-04-19 ENCOUNTER — Ambulatory Visit (INDEPENDENT_AMBULATORY_CARE_PROVIDER_SITE_OTHER): Payer: Medicare HMO | Admitting: Physician Assistant

## 2016-04-19 ENCOUNTER — Encounter: Payer: Self-pay | Admitting: Physician Assistant

## 2016-04-19 VITALS — BP 130/90 | HR 70 | Temp 98.0°F | Resp 16 | Ht 62.0 in | Wt 126.4 lb

## 2016-04-19 DIAGNOSIS — J302 Other seasonal allergic rhinitis: Secondary | ICD-10-CM | POA: Diagnosis not present

## 2016-04-19 DIAGNOSIS — J321 Chronic frontal sinusitis: Secondary | ICD-10-CM | POA: Diagnosis not present

## 2016-04-19 MED ORDER — DOXYCYCLINE HYCLATE 100 MG PO CAPS: 100.0000 mg | ORAL_CAPSULE | Freq: Two times a day (BID) | ORAL | 0 refills | Status: DC

## 2016-04-19 NOTE — Progress Notes (Signed)
Tina Bray Outlaw  MRN: [id number] DOB: April 18, 1951  PCP: Reginia Forts, MD  Subjective:  Pt is a 65 year old women, history of uncontrolled allergies with recurrent sinus infections, presents to clinic for sinus pain x 3 months. Her symptoms started as a runny nose, watery eyes and cough which developed into ear pain and sinus pressure. Today she describes her sinus pain as "stabbing" under her eyes and says her head "feels full".   She cannot participate in her yoga class due to recent decline in balancing.  No fevers.  Tried over the counter allergy pills, nasal spray and eye drops. Nothing helps.  History of allergies. In her past she has received shots into her sinuses from a doctor in [place] for allergy relief.    Review of Systems  Constitutional: Negative.   HENT: Positive for congestion, ear pain, rhinorrhea, sinus pressure and sneezing.   Eyes: Positive for discharge, redness and itching.  Respiratory: Positive for cough. Negative for chest tightness, shortness of breath and wheezing.   Cardiovascular: Negative.   Allergic/Immunologic: Positive for environmental allergies.  Neurological: Positive for dizziness and headaches.    Patient Active Problem List   Diagnosis Date Noted  . IBS (irritable bowel syndrome) 10/26/2015  . Anxiety 11/14/2011  . GERD (gastroesophageal reflux disease) 11/14/2011  . Allergic rhinitis 11/14/2011    Current Outpatient Prescriptions on File Prior to Visit  Medication Sig Dispense Refill  . ALPRAZolam (XANAX) 0.5 MG tablet Take 1 tablet (0.5 mg total) by mouth 2 (two) times daily as needed. 60 tablet 5  . b complex vitamins tablet Take 1 tablet by mouth daily.    . cholecalciferol (VITAMIN D) 1000 UNITS tablet Take 1,000 Units by mouth daily.    Marland Kitchen escitalopram (LEXAPRO) 20 MG tablet Take 1 tablet (20 mg total) by mouth daily. 90 tablet 3  . estradiol (ESTRACE) 0.1 MG/GM vaginal cream Place 0.2 applicator vaginally twice a week 42.5  g 11  . fexofenadine (ALLEGRA) 180 MG tablet Take 180 mg by mouth as needed.     . Multiple Vitamin (MULTIVITAMIN) tablet Take 1 tablet by mouth daily.    . pantoprazole (PROTONIX) 40 MG tablet Take 1 tablet (40 mg total) by mouth daily. 30 tablet 11   No current facility-administered medications on file prior to visit.     Allergies  Allergen Reactions  . Erythromycin Anaphylaxis  . Demerol Other (See Comments)    blisters  . Penicillins Hives  . Sulfa Antibiotics Hives    Objective:  BP 130/90 (BP Location: Left Arm, Patient Position: Sitting, Cuff Size: Normal)   Pulse 70   Temp 98 F (36.7 C) (Oral)   Resp 16   Ht 5\' 2"  (0000000 m)   Wt 126 lb 6.4 oz (57.3 kg)   SpO2 99%   BMI 23.12 kg/m   Physical Exam  Constitutional: She is oriented to person, place, and time and well-developed, well-nourished, and in no distress. No distress.  HENT:  Head:    Nose: Mucosal edema present. Right sinus exhibits maxillary sinus tenderness and frontal sinus tenderness. Left sinus exhibits maxillary sinus tenderness and frontal sinus tenderness.  Mouth/Throat: Uvula is midline.  Swelling and erythema noted b/l nasal turbinates. Cobbelstoning of oropharynx appreciated  Eyes: Conjunctivae are normal. Pupils are equal, round, and reactive to light.  Cardiovascular: Normal rate, regular rhythm and normal heart sounds.   Pulmonary/Chest: Effort normal and breath sounds normal. She has no wheezes.  Neurological: She is alert and oriented to  person, place, and time. GCS score is 15.  Skin: Skin is warm and dry. She is not diaphoretic.  Psychiatric: Mood, memory, affect and judgment normal.  Vitals reviewed.   Assessment and Plan :  1. Frontal sinusitis, unspecified chronicity - doxycycline (VIBRAMYCIN) 100 MG capsule; Take 1 capsule (100 mg total) by mouth 2 (two) times daily.  Dispense: 20 capsule; Refill: 0 - Allergic to penicillin, erythromycin, and sulfa  2. Seasonal allergies -  Ambulatory referral to Allergy - Due to significant history of allergies and failure of OTC treatment regimens and recurrent sinus infections, referral to allergies indicated. Patient agrees.    Mercer Pod, PA-C  [facility] 04/19/2016 2:45 PM

## 2016-04-19 NOTE — Patient Instructions (Addendum)
Thank you for coming in today. I hope you feel we met your needs.  Feel free to call [facility] if you have any questions or further requests.  Please consider signing up for MyChart if you do not already have it, as this is a great way to communicate with me.  Best,  Whitney McVey, PA-C    IF you received an x-ray today, you will receive an invoice from [facility]. Please contact [facility] at [phone] with questions or concerns regarding your invoice.   IF you received labwork today, you will receive an invoice from [employer]. Please contact [facility] at [phone] with questions or concerns regarding your invoice.   Our billing staff will not be able to assist you with questions regarding bills from these companies.  You will be contacted with the lab results as soon as they are available. The fastest way to get your results is to activate your My Chart account. Instructions are located on the last page of this paperwork. If you have not heard from [place] regarding the results in 2 weeks, please contact this office.     Hay Fever Hay fever is an allergic reaction to particles in the air. It cannot be passed from person to person. It cannot be cured, but it can be controlled. CAUSES  Hay fever is caused by something that triggers an allergic reaction (allergens). The following are examples of allergens:  Ragweed.  Feathers.  Animal dander.  Grass and tree pollens.  Cigarette smoke.  House dust.  Pollution. SYMPTOMS   Sneezing.  Runny or stuffy nose.  Tearing eyes.  Itchy eyes, nose, mouth, throat, skin, or other area.  Sore throat.  Headache.  Decreased sense of smell or taste. DIAGNOSIS Your caregiver will perform a physical exam and ask questions about the symptoms you are having.Allergy testing may be done to determine exactly what triggers your hay fever.  TREATMENT   Over-the-counter medicines may help  symptoms. These include:  Antihistamines.  Decongestants. These may help with nasal congestion.  Your caregiver may prescribe medicines if over-the-counter medicines do not work.  Some people benefit from allergy shots when other medicines are not helpful. HOME CARE INSTRUCTIONS   Avoid the allergen that is causing your symptoms, if possible.  Take all medicine as told by your caregiver. SEEK MEDICAL CARE IF:   You have severe allergy symptoms and your current medicines are not helping.  Your treatment was working at one time, but you are now experiencing symptoms.  You have sinus congestion and pressure.  You develop a fever or headache.  You have thick nasal discharge.  You have asthma and have a worsening cough and wheezing. SEEK IMMEDIATE MEDICAL CARE IF:   You have swelling of your tongue or lips.  You have trouble breathing.  You feel lightheaded or like you are going to faint.  You have cold sweats.  You have a fever.   This information is not intended to replace advice given to you by your health care provider. Make sure you discuss any questions you have with your health care provider.   Document Released: 08/22/2005 Document Revised: 11/14/2011 Document Reviewed: 03/04/2015 Elsevier Interactive Patient Education [employer].

## 2016-07-08 ENCOUNTER — Ambulatory Visit (INDEPENDENT_AMBULATORY_CARE_PROVIDER_SITE_OTHER): Payer: Medicare HMO | Admitting: Physician Assistant

## 2016-07-08 DIAGNOSIS — Z23 Encounter for immunization: Secondary | ICD-10-CM

## 2016-07-12 ENCOUNTER — Other Ambulatory Visit: Payer: Self-pay | Admitting: Gastroenterology

## 2016-07-13 DIAGNOSIS — H1045 Other chronic allergic conjunctivitis: Secondary | ICD-10-CM | POA: Diagnosis not present

## 2016-07-13 DIAGNOSIS — Z91030 Bee allergy status: Secondary | ICD-10-CM | POA: Diagnosis not present

## 2016-07-13 DIAGNOSIS — R05 Cough: Secondary | ICD-10-CM | POA: Diagnosis not present

## 2016-07-13 DIAGNOSIS — J3089 Other allergic rhinitis: Secondary | ICD-10-CM | POA: Diagnosis not present

## 2016-07-13 DIAGNOSIS — J309 Allergic rhinitis, unspecified: Secondary | ICD-10-CM | POA: Diagnosis not present

## 2016-08-24 DIAGNOSIS — Z0000 Encounter for general adult medical examination without abnormal findings: Secondary | ICD-10-CM | POA: Diagnosis not present

## 2016-08-24 DIAGNOSIS — Z6822 Body mass index (BMI) 22.0-22.9, adult: Secondary | ICD-10-CM | POA: Diagnosis not present

## 2016-08-24 DIAGNOSIS — K219 Gastro-esophageal reflux disease without esophagitis: Secondary | ICD-10-CM | POA: Diagnosis not present

## 2016-08-24 DIAGNOSIS — R69 Illness, unspecified: Secondary | ICD-10-CM | POA: Diagnosis not present

## 2016-10-04 DIAGNOSIS — H40013 Open angle with borderline findings, low risk, bilateral: Secondary | ICD-10-CM | POA: Diagnosis not present

## 2016-10-18 ENCOUNTER — Other Ambulatory Visit: Payer: Self-pay | Admitting: Gastroenterology

## 2016-10-19 ENCOUNTER — Ambulatory Visit (INDEPENDENT_AMBULATORY_CARE_PROVIDER_SITE_OTHER): Payer: Medicare HMO

## 2016-10-19 ENCOUNTER — Ambulatory Visit (INDEPENDENT_AMBULATORY_CARE_PROVIDER_SITE_OTHER): Payer: Medicare HMO | Admitting: Physician Assistant

## 2016-10-19 VITALS — BP 128/80 | HR 65 | Temp 98.5°F | Resp 16 | Ht 63.0 in | Wt 129.0 lb

## 2016-10-19 DIAGNOSIS — R0789 Other chest pain: Secondary | ICD-10-CM

## 2016-10-19 DIAGNOSIS — F410 Panic disorder [episodic paroxysmal anxiety] without agoraphobia: Secondary | ICD-10-CM | POA: Diagnosis not present

## 2016-10-19 DIAGNOSIS — F341 Dysthymic disorder: Secondary | ICD-10-CM

## 2016-10-19 DIAGNOSIS — K219 Gastro-esophageal reflux disease without esophagitis: Secondary | ICD-10-CM

## 2016-10-19 DIAGNOSIS — D72819 Decreased white blood cell count, unspecified: Secondary | ICD-10-CM | POA: Diagnosis not present

## 2016-10-19 DIAGNOSIS — I440 Atrioventricular block, first degree: Secondary | ICD-10-CM | POA: Diagnosis not present

## 2016-10-19 DIAGNOSIS — R69 Illness, unspecified: Secondary | ICD-10-CM | POA: Diagnosis not present

## 2016-10-19 DIAGNOSIS — R079 Chest pain, unspecified: Secondary | ICD-10-CM | POA: Diagnosis not present

## 2016-10-19 LAB — POCT CBC
GRANULOCYTE PERCENT: 54.4 % (ref 37–80)
HCT, POC: 41.7 % (ref 37.7–47.9)
HEMOGLOBIN: 14.7 g/dL (ref 12.2–16.2)
Lymph, poc: 1.8 (ref 0.6–3.4)
MCH: 31.1 pg (ref 27–31.2)
MCHC: 35.3 g/dL (ref 31.8–35.4)
MCV: 88.1 fL (ref 80–97)
MID (CBC): 0.2 (ref 0–0.9)
MPV: 8.1 fL (ref 0–99.8)
PLATELET COUNT, POC: 183 10*3/uL (ref 142–424)
POC Granulocyte: 2.4 (ref 2–6.9)
POC LYMPH PERCENT: 40.3 %L (ref 10–50)
POC MID %: 5.3 %M (ref 0–12)
RBC: 4.74 M/uL (ref 4.04–5.48)
RDW, POC: 13.1 %
WBC: 4.5 10*3/uL — AB (ref 4.6–10.2)

## 2016-10-19 MED ORDER — PANTOPRAZOLE SODIUM 40 MG PO TBEC: 40.0000 mg | DELAYED_RELEASE_TABLET | Freq: Every day | ORAL | 0 refills | Status: DC

## 2016-10-19 MED ORDER — ESCITALOPRAM OXALATE 20 MG PO TABS: 20.0000 mg | ORAL_TABLET | Freq: Every day | ORAL | 3 refills | Status: DC

## 2016-10-19 MED ORDER — ALPRAZOLAM 0.5 MG PO TABS: ORAL_TABLET | ORAL | 1 refills | Status: DC

## 2016-10-19 NOTE — Progress Notes (Signed)
10/19/2016 3:34 PM   DOB: May 23, 1951 / MRN: [id number]  SUBJECTIVE:  Tina Bray is a 66 y.o. female presenting for left sided substernal chest pain "that just hurts" as she points to her chest.  States the pain comes and goes, and she will wake up at night with her heart pounding and she will be drenched in sweat.  Denies dizziness, radicular pattern, nausea, and has a non exertional component. It may last a minute or two. She has never had SOB with this.   She takes Xanax sporadically.  She has been on this for several years.  She may take 1/2 tab or two weekly.  Take this when she feels frustrated and "needs to calm down." She can't get back to sleep most of the time for about 1-2 hours. She takes lexapro nightly with dinner nightly.   She take protonix for GERD.  Will sometimes have episodes however this is mostly controlled with protonix. She denies difficulty swallowing, painful swallowing, never smoker, unintentional weight loss.  She has been taking this for about 20 years. She has never had an endoscopy. She feels this is stable.   Depression screen Tina Bray 2/9 10/19/2016  Decreased Interest 0  Down, Depressed, Hopeless 1  PHQ - 2 Score 1      is allergic to erythromycin; demerol; penicillins; and sulfa antibiotics.   She  has a past medical history of Allergy; Anxiety; Asthma; Depression; GERD (gastroesophageal reflux disease); IBS (irritable bowel syndrome); Kidney stones; Meningitis; Osteopenia; and Pneumonia.    She  reports that she has never smoked. She has never used smokeless tobacco. She reports that she drinks about 2.4 oz of alcohol per week . She reports that she does not use drugs. She  reports that she currently engages in sexual activity. She reports using the following method of birth control/protection: None. The patient  has a past surgical history that includes Appendectomy; Cholecystectomy; Cosmetic surgery; Tubal ligation; Carpal tunnel release; orthoscopic  shoulder; Colonoscopy (09/05/1998); Tonsillectomy; Spine surgery; and Knee arthroscopy.  Her family history includes Alcohol abuse in her father; Arthritis in her brother; Diabetes in her mother; Heart disease (age of onset: 24) in her mother; Hypertension in her mother; Lung cancer in her mother; Stroke in her father.  Review of Systems  Constitutional: Negative for chills and fever.  HENT: Negative for sore throat.   Skin: Negative for itching and rash.  Neurological: Negative for dizziness.  Psychiatric/Behavioral: Negative for depression, hallucinations, memory loss, substance abuse and suicidal ideas. The patient does not have insomnia.     The problem list and medications were reviewed and updated by myself where necessary and exist elsewhere in the encounter.   OBJECTIVE:  BP 128/80 (BP Location: Right Arm, Patient Position: Sitting, Cuff Size: Normal)   Pulse 65   Temp 98.5 F (36.9 C) (Oral)   Resp 16   Ht 5\' 3"  (1.6 m)   Wt 129 lb (58.5 kg)   SpO2 96%   BMI 22.85 kg/m   Physical Exam  Constitutional: She is oriented to person, place, and time.  Cardiovascular: Normal rate and regular rhythm.   Pulmonary/Chest: Effort normal and breath sounds normal.  Musculoskeletal: Normal range of motion.  Neurological: She is alert and oriented to person, place, and time. She displays normal reflexes. No cranial nerve deficit. She exhibits normal muscle tone. Coordination normal.  Skin: Skin is warm and dry.  Psychiatric: Her behavior is normal. Judgment and thought content normal. Her mood appears  not anxious. Her affect is not angry, not blunt, not labile and not inappropriate. Her speech is not rapid and/or pressured, not delayed, not tangential and not slurred. She is not slowed and not withdrawn. Cognition and memory are normal. She does not exhibit a depressed mood. She is communicative.   Wt Readings from Last 3 Encounters:  10/19/16 129 lb (58.5 kg)  04/19/16 126 lb 6.4 oz (57.3  kg)  12/11/15 126 lb (57.2 kg)     Lab Results  Component Value Date   CHOL 192 12/11/2013   HDL 68 12/11/2013   LDLCALC 110 (H) 12/11/2013   TRIG 69 12/11/2013   CHOLHDL 2.8 12/11/2013   Lab Results  Component Value Date   HGBA1C 5.4 12/11/2013   Lab Results  Component Value Date   TSH 1.736 12/11/2013   EKG: 1st degree block noted, no interval changes from previous. No signs of ischemia and infarction.   Results for orders placed or performed in visit on 10/19/16 (from the past 72 hour(s))  POCT CBC     Status: Abnormal   Collection Time: 10/19/16  3:06 PM  Result Value Ref Range   WBC 4.5 (A) 4.6 - 10.2 K/uL   Lymph, poc 1.8 0.6 - 3.4   POC LYMPH PERCENT 40.3 10 - 50 %L   MID (cbc) 0.2 0 - 0.9   POC MID % 5.3 0 - 12 %M   POC Granulocyte 2.4 2 - 6.9   Granulocyte percent 54.4 37 - 80 %G   RBC 4.74 4.04 - 5.48 M/uL   Hemoglobin 14.7 12.2 - 16.2 g/dL   HCT, POC 41.7 37.7 - 47.9 %   MCV 88.1 80 - 97 fL   MCH, POC 31.1 27 - 31.2 pg   MCHC 35.3 31.8 - 35.4 g/dL   RDW, POC 13.1 %   Platelet Count, POC 183 142 - 424 K/uL   MPV 8.1 0 - 99.8 fL   CBC Latest Ref Rng & Units 10/19/2016 06/04/2015 12/11/2013  WBC 4.6 - 10.2 K/uL 4.5(A) 4.1(A) 4.1  Hemoglobin 12.2 - 16.2 g/dL 14.7 14.0 14.2  Hematocrit 37.7 - 47.9 % 41.7 43.2 42.6  Platelets 150 - 400 K/uL - - 237     Dg Chest 2 View  Result Date: 10/19/2016 CLINICAL DATA:  Chest pain for 2 months EXAM: CHEST  2 VIEW COMPARISON:  None. FINDINGS: There is no edema or consolidation. Heart size and pulmonary vascularity are normal. No adenopathy. There is degenerative change in the lower thoracic region. No pneumothorax. IMPRESSION: No edema or consolidation. Electronically Signed   By: Lowella Grip III M.D.   On: 10/19/2016 15:28    ASSESSMENT AND PLAN:  Tina Bray was seen today for chest pain and medication refill.  Diagnoses and all orders for this visit:  Atypical chest pain Comments: Atypical at best.  Minimal  risk factors.  Given age and first degree block (existing) will have her see cards.   Orders: -     EKG 12-Lead -     DG Chest 2 View; Future -     POCT CBC -     TSH -     Basic metabolic panel -     Ambulatory referral to Cardiology  First degree AV block -     Ambulatory referral to Cardiology  Dysthymia Comments: Controlled. Continue current plan.  Orders: -     escitalopram (LEXAPRO) 20 MG tablet; Take 1 tablet (20 mg total) by mouth daily.  Panic attack Comments: Given her HPI I am reducing her refill quantity from 60 tabs with 6 refills to 25 with 1 refill.  Orders: -     ALPRAZolam (XANAX) 0.5 MG tablet; Use sparingly and take for panic symptoms only.  Gastroesophageal reflux disease, esophagitis presence not specified -     pantoprazole (PROTONIX) 40 MG tablet; Take 1 tablet (40 mg total) by mouth daily.  Leukopenia, unspecified type -     Pathologist smear review    The patient is advised to call or return to clinic if she does not see an improvement in symptoms, or to seek the care of the closest emergency department if she worsens with the above plan.   Philis Fendt, MHS, PA-C [facility] 10/19/2016 3:33 PM

## 2016-10-19 NOTE — Patient Instructions (Signed)
     IF you received an x-ray today, you will receive an invoice from [facility]. Please contact [facility] at [phone] with questions or concerns regarding your invoice.   IF you received labwork today, you will receive an invoice from [facility]. Please contact [facility] at [phone] with questions or concerns regarding your invoice.   Our billing staff will not be able to assist you with questions regarding bills from these companies.  You will be contacted with the lab results as soon as they are available. The fastest way to get your results is to activate your My Chart account. Instructions are located on the last page of this paperwork. If you have not heard from us regarding the results in 2 weeks, please contact this office.     

## 2016-10-20 LAB — BASIC METABOLIC PANEL
BUN / CREAT RATIO: 29 — AB (ref 12–28)
BUN: 21 mg/dL (ref 8–27)
CO2: 22 mmol/L (ref 18–29)
CREATININE: 0.72 mg/dL (ref 0.57–1.00)
Calcium: 9.6 mg/dL (ref 8.7–10.3)
Chloride: 103 mmol/L (ref 96–106)
GFR, EST AFRICAN AMERICAN: 101 mL/min/{1.73_m2} (ref >59)
GFR, EST NON AFRICAN AMERICAN: 88 mL/min/{1.73_m2} (ref >59)
Glucose: 84 mg/dL (ref 65–99)
POTASSIUM: 4.4 mmol/L (ref 3.5–5.2)
SODIUM: 142 mmol/L (ref 134–144)

## 2016-10-20 LAB — TSH: TSH: 2.33 u[IU]/mL (ref 0.450–4.500)

## 2016-10-28 LAB — PATHOLOGIST SMEAR REVIEW
BASOS: 1 %
Basophils Absolute: 0 10*3/uL (ref 0.0–0.2)
EOS (ABSOLUTE): 0.1 10*3/uL (ref 0.0–0.4)
Eos: 2 %
HEMATOCRIT: 42.5 % (ref 34.0–46.6)
HEMOGLOBIN: 14.4 g/dL (ref 11.1–15.9)
IMMATURE GRANULOCYTES: 0 %
Immature Grans (Abs): 0 10*3/uL (ref 0.0–0.1)
LYMPHS ABS: 1.6 10*3/uL (ref 0.7–3.1)
Lymphs: 38 %
MCH: 30.1 pg (ref 26.6–33.0)
MCHC: 33.9 g/dL (ref 31.5–35.7)
MCV: 89 fL (ref 79–97)
MONOS ABS: 0.3 10*3/uL (ref 0.1–0.9)
Monocytes: 6 %
NEUTROS ABS: 2.3 10*3/uL (ref 1.4–7.0)
Neutrophils: 53 %
PATH REV WBC: NORMAL
Path Rev PLTs: NORMAL
Path Rev RBC: NORMAL
Platelets: 209 10*3/uL (ref 150–379)
RBC: 4.78 x10E6/uL (ref 3.77–5.28)
RDW: 13.3 % (ref 12.3–15.4)
WBC: 4.4 10*3/uL (ref 3.4–10.8)

## 2016-11-15 ENCOUNTER — Other Ambulatory Visit: Payer: Self-pay | Admitting: Physician Assistant

## 2016-11-15 DIAGNOSIS — K219 Gastro-esophageal reflux disease without esophagitis: Secondary | ICD-10-CM

## 2016-11-16 ENCOUNTER — Other Ambulatory Visit: Payer: Self-pay | Admitting: Physician Assistant

## 2016-11-16 DIAGNOSIS — R079 Chest pain, unspecified: Secondary | ICD-10-CM | POA: Diagnosis not present

## 2016-11-16 DIAGNOSIS — R030 Elevated blood-pressure reading, without diagnosis of hypertension: Secondary | ICD-10-CM | POA: Diagnosis not present

## 2016-11-16 DIAGNOSIS — K219 Gastro-esophageal reflux disease without esophagitis: Secondary | ICD-10-CM

## 2016-11-17 DIAGNOSIS — R079 Chest pain, unspecified: Secondary | ICD-10-CM

## 2017-03-15 ENCOUNTER — Other Ambulatory Visit: Payer: Self-pay | Admitting: Family Medicine

## 2017-03-15 DIAGNOSIS — K219 Gastro-esophageal reflux disease without esophagitis: Secondary | ICD-10-CM

## 2017-03-20 ENCOUNTER — Encounter: Payer: Self-pay | Admitting: Physician Assistant

## 2017-03-20 ENCOUNTER — Ambulatory Visit (INDEPENDENT_AMBULATORY_CARE_PROVIDER_SITE_OTHER): Payer: Medicare HMO | Admitting: Physician Assistant

## 2017-03-20 VITALS — BP 144/80 | HR 73 | Temp 98.4°F | Resp 18 | Ht 63.0 in | Wt 127.4 lb

## 2017-03-20 DIAGNOSIS — K219 Gastro-esophageal reflux disease without esophagitis: Secondary | ICD-10-CM

## 2017-03-20 DIAGNOSIS — J019 Acute sinusitis, unspecified: Secondary | ICD-10-CM | POA: Diagnosis not present

## 2017-03-20 DIAGNOSIS — B9689 Other specified bacterial agents as the cause of diseases classified elsewhere: Secondary | ICD-10-CM | POA: Diagnosis not present

## 2017-03-20 MED ORDER — PANTOPRAZOLE SODIUM 40 MG PO TBEC: 40.0000 mg | DELAYED_RELEASE_TABLET | Freq: Every day | ORAL | 3 refills | Status: DC

## 2017-03-20 MED ORDER — DOXYCYCLINE HYCLATE 100 MG PO CAPS: 100.0000 mg | ORAL_CAPSULE | Freq: Two times a day (BID) | ORAL | 0 refills | Status: AC

## 2017-03-20 NOTE — Patient Instructions (Addendum)
Call me in 10-14 days if your sinuses are not feeling better so we can try prednisone.      IF you received an x-ray today, you will receive an invoice from [facility]. Please contact [facility] at [phone] with questions or concerns regarding your invoice.   IF you received labwork today, you will receive an invoice from [place] [facility]. Please contact [facility] at [phone] with questions or concerns regarding your invoice.   Our billing staff will not be able to assist you with questions regarding bills from these companies.  You will be contacted with the lab results as soon as they are available. The fastest way to get your results is to activate your My Chart account. Instructions are located on the last page of this paperwork. If you have not heard from [place] regarding the results in 2 weeks, please contact this office.     We recommend that you schedule a mammogram for breast cancer screening. Typically, you do not need a referral to do this. Please contact a local imaging center to schedule your mammogram.  [facility] - [phone]  *ask for the Radiology Department [facility] - [phone] or [phone]  [facility] - [phone] [place] [phone] [facility] - [phone]  *ask for the [facility] - [phone]  *ask for the Radiology Department [facility] - [phone]  *ask for the [place] - [phone]

## 2017-03-20 NOTE — Progress Notes (Signed)
03/20/2017 5:44 PM   DOB: 1951/02/16 / MRN: [id number]  SUBJECTIVE:  Tina Bray is a 66 y.o. female presenting for protonix refills.  Tells me that continues to have some GERD and she may have 1 episode a week but this varies. No difficulty swallwing and no painful swallowing.   She has been having sinus issues for months now.  Associates teeth pain.  Denies fever.  Has tried Xyzal, nettie pot, and Flonase and this is not helping. Has a history of chronic sinusitis.   She is allergic to erythromycin; demerol; penicillins; and sulfa antibiotics.   She  has a past medical history of Allergy; Anxiety; Asthma; Depression; GERD (gastroesophageal reflux disease); IBS (irritable bowel syndrome); Kidney stones; Meningitis; Osteopenia; and Pneumonia.    She  reports that she has never smoked. She has never used smokeless tobacco. She reports that she drinks about 2.4 oz of alcohol per week . She reports that she does not use drugs. She  reports that she currently engages in sexual activity. She reports using the following method of birth control/protection: None. The patient  has a past surgical history that includes Appendectomy; Cholecystectomy; Cosmetic surgery; Tubal ligation; Carpal tunnel release; orthoscopic shoulder; Colonoscopy (09/05/1998); Tonsillectomy; Spine surgery; and Knee arthroscopy.  Her family history includes Alcohol abuse in her father; Arthritis in her brother; Diabetes in her mother; Heart disease (age of onset: 59) in her mother; Hypertension in her mother; Irritable bowel syndrome in her unknown relative; Lung cancer in her mother; Stroke in her father.  Review of Systems  Constitutional: Negative for chills, diaphoresis and fever.  Respiratory: Negative for cough, hemoptysis, sputum production, shortness of breath and wheezing.   Cardiovascular: Negative for chest pain, orthopnea and leg swelling.  Gastrointestinal: Negative for nausea.  Skin: Negative for rash.    Neurological: Negative for dizziness.    The problem list and medications were reviewed and updated by myself where necessary and exist elsewhere in the encounter.   OBJECTIVE:  BP (!) 144/80   Pulse 73   Temp 98.4 F (36.9 C) (Oral)   Resp 18   Ht 5\' 3"  (1.6 m)   Wt 127 lb 6.4 oz (57.8 kg)   SpO2 98%   BMI 22.57 kg/m   BP Readings from Last 3 Encounters:  03/20/17 (!) 144/80  10/19/16 128/80  04/19/16 130/90     Physical Exam  Constitutional: She is active.  Non-toxic appearance.  HENT:  Head:    Right Ear: Hearing, tympanic membrane, external ear and ear canal normal.  Left Ear: Hearing, tympanic membrane, external ear and ear canal normal.  Nose: Nose normal. Right sinus exhibits no maxillary sinus tenderness and no frontal sinus tenderness. Left sinus exhibits no maxillary sinus tenderness and no frontal sinus tenderness.  Mouth/Throat: Uvula is midline, oropharynx is clear and moist and mucous membranes are normal. Mucous membranes are not dry. No oropharyngeal exudate, posterior oropharyngeal edema or tonsillar abscesses.  Cardiovascular: Normal rate.   Pulmonary/Chest: Effort normal. No tachypnea.  Lymphadenopathy:       Head (right side): No submandibular and no tonsillar adenopathy present.       Head (left side): No submandibular and no tonsillar adenopathy present.    She has no cervical adenopathy.  Neurological: She is alert.  Skin: Skin is warm and dry. She is not diaphoretic. No pallor.    No results found for this or any previous visit (from the past 72 hour(s)).  No results found.  ASSESSMENT AND PLAN:  Jhayla was seen today for sinusitis and medication refill.  Diagnoses and all orders for this visit:  Gastroesophageal reflux disease, esophagitis presence not specified -     pantoprazole (PROTONIX) 40 MG tablet; Take 1 tablet (40 mg total) by mouth daily. Take 30 minutes before breakfast daily.  Acute bacterial sinusitis -     doxycycline  (VIBRAMYCIN) 100 MG capsule; Take 1 capsule (100 mg total) by mouth 2 (two) times daily.  Other orders -     Cancel: MM DIGITAL SCREENING BILATERAL; Future    The patient is advised to call or return to clinic if she does not see an improvement in symptoms, or to seek the care of the closest emergency department if she worsens with the above plan.   Philis Fendt, MHS, PA-C [facility] [place] 03/20/2017 5:44 PM

## 2017-05-30 ENCOUNTER — Ambulatory Visit (INDEPENDENT_AMBULATORY_CARE_PROVIDER_SITE_OTHER): Payer: Medicare HMO | Admitting: Physician Assistant

## 2017-05-30 ENCOUNTER — Ambulatory Visit (INDEPENDENT_AMBULATORY_CARE_PROVIDER_SITE_OTHER): Payer: Medicare HMO

## 2017-05-30 ENCOUNTER — Encounter: Payer: Self-pay | Admitting: Physician Assistant

## 2017-05-30 VITALS — HR 64 | Resp 16 | Ht 63.0 in | Wt 128.8 lb

## 2017-05-30 DIAGNOSIS — M79671 Pain in right foot: Secondary | ICD-10-CM | POA: Diagnosis not present

## 2017-05-30 DIAGNOSIS — M19071 Primary osteoarthritis, right ankle and foot: Secondary | ICD-10-CM | POA: Diagnosis not present

## 2017-05-30 MED ORDER — MELOXICAM 7.5 MG PO TABS: 7.5000 mg | ORAL_TABLET | Freq: Every day | ORAL | 0 refills | Status: DC

## 2017-05-30 NOTE — Patient Instructions (Addendum)
  Wear the post op shoe for about 1-2 weeks and take the meloxicam if you need to.  Call me in about 2 weeks if it is not getting better and I will send you to a podiatrist for a second opinion.    IF you received an x-ray today, you will receive an invoice from [facility]. Please contact [facility] at [phone] with questions or concerns regarding your invoice.   IF you received labwork today, you will receive an invoice from [place]. Please contact [facility] at [phone] with questions or concerns regarding your invoice.   Our billing staff will not be able to assist you with questions regarding bills from these companies.  You will be contacted with the lab results as soon as they are available. The fastest way to get your results is to activate your My Chart account. Instructions are located on the last page of this paperwork. If you have not heard from [place] regarding the results in 2 weeks, please contact this office.

## 2017-05-30 NOTE — Progress Notes (Signed)
    06/01/2017 11:14 AM   DOB: [birth year] / MRN: [id number]  SUBJECTIVE:  Tina Bray is a 66 y.o. female presenting for stabbing intermittent right foot pain about the base of the second and third toe that is worse with ambulation. She has tried OTC Aleve and tells me that this did not really help.   She is allergic to erythromycin; demerol; penicillins; and sulfa antibiotics.   She  has a past medical history of Allergy; Anxiety; Asthma; Depression; GERD (gastroesophageal reflux disease); IBS (irritable bowel syndrome); Kidney stones; Meningitis; Osteopenia; and Pneumonia.    She  reports that she has never smoked. She has never used smokeless tobacco. She reports that she drinks about 2.4 oz of alcohol per week . She reports that she does not use drugs. She  reports that she currently engages in sexual activity. She reports using the following method of birth control/protection: None. The patient  has a past surgical history that includes Appendectomy; Cholecystectomy; Cosmetic surgery; Tubal ligation; Carpal tunnel release; orthoscopic shoulder; Colonoscopy (09/05/1998); Tonsillectomy; Spine surgery; and Knee arthroscopy.  Her family history includes Alcohol abuse in her father; Arthritis in her brother; Diabetes in her mother; Heart disease (age of onset: 69) in her mother; Hypertension in her mother; Irritable bowel syndrome in her unknown relative; Lung cancer in her mother; Stroke in her father.  Review of Systems  Constitutional: Negative for chills, diaphoresis and fever.  Respiratory: Negative for shortness of breath.   Cardiovascular: Negative for chest pain, orthopnea and leg swelling.  Gastrointestinal: Negative for nausea.  Musculoskeletal: Positive for joint pain. Negative for myalgias.  Skin: Negative for rash.  Neurological: Negative for dizziness.    The problem list and medications were reviewed and updated by myself where necessary and exist elsewhere in the  encounter.   OBJECTIVE:  Pulse 64   Resp 16   Ht 5\' 3"  (1.6 m)   Wt 128 lb 12.8 oz (58.4 kg)   SpO2 98%   BMI 22.82 kg/m   Physical Exam  Constitutional: She is active.  Non-toxic appearance.  Cardiovascular: Normal rate.   Pulmonary/Chest: Effort normal. No tachypnea.  Musculoskeletal: She exhibits tenderness (right base of the 2nd and 3rd toe). She exhibits no edema or deformity.  Neurological: She is alert.  Skin: Skin is warm and dry. No rash noted. She is not diaphoretic. No pallor.    No results found for this or any previous visit (from the past 72 hour(s)).  No results found.  ASSESSMENT AND PLAN:  Tina Bray was seen today for foot pain.  Diagnoses and all orders for this visit:  Right foot pain: Unlikely gout given no rash.  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  Patient did get some relief with a post op shoe here.  She will try this and meloxicam if needed for about two weeks and will call after that time if still symptomatic so I can refer to podiatry.  -     DG Foot Complete Right; Future -     meloxicam (MOBIC) 7.5 MG tablet; Take 1 tablet (7.5 mg total) by mouth daily.    The patient is advised to call or return to clinic if she does not see an improvement in symptoms, or to seek the care of the closest emergency department if she worsens with the above plan.   Philis Fendt, MHS, PA-C [facility] 06/01/2017 11:14 AM

## 2017-06-21 DIAGNOSIS — Z23 Encounter for immunization: Secondary | ICD-10-CM | POA: Diagnosis not present

## 2017-07-05 ENCOUNTER — Telehealth: Payer: Self-pay | Admitting: Physician Assistant

## 2017-07-05 DIAGNOSIS — M79674 Pain in right toe(s): Secondary | ICD-10-CM

## 2017-07-05 NOTE — Telephone Encounter (Signed)
Pt called requesting a referral for her foot as well as a referral for her arthritis. Please advise. Pt can be reached at [phone].

## 2017-07-05 NOTE — Telephone Encounter (Signed)
Hey guy. Please see my plan in the last note. Would you mind placing a referral to podiatry for me?

## 2017-07-08 ENCOUNTER — Other Ambulatory Visit: Payer: Self-pay | Admitting: Physician Assistant

## 2017-07-08 DIAGNOSIS — M79673 Pain in unspecified foot: Principal | ICD-10-CM

## 2017-07-08 DIAGNOSIS — G8929 Other chronic pain: Secondary | ICD-10-CM

## 2017-07-09 ENCOUNTER — Other Ambulatory Visit: Payer: Self-pay | Admitting: Physician Assistant

## 2017-07-09 DIAGNOSIS — M79671 Pain in right foot: Secondary | ICD-10-CM

## 2017-07-20 ENCOUNTER — Ambulatory Visit (INDEPENDENT_AMBULATORY_CARE_PROVIDER_SITE_OTHER): Payer: Medicare HMO

## 2017-07-20 ENCOUNTER — Encounter: Payer: Self-pay | Admitting: Podiatry

## 2017-07-20 ENCOUNTER — Ambulatory Visit: Payer: Medicare HMO | Admitting: Podiatry

## 2017-07-20 DIAGNOSIS — M779 Enthesopathy, unspecified: Principal | ICD-10-CM

## 2017-07-20 DIAGNOSIS — M7751 Other enthesopathy of right foot: Secondary | ICD-10-CM

## 2017-07-20 DIAGNOSIS — M778 Other enthesopathies, not elsewhere classified: Secondary | ICD-10-CM

## 2017-07-20 DIAGNOSIS — G5780 Other specified mononeuropathies of unspecified lower limb: Secondary | ICD-10-CM

## 2017-07-20 DIAGNOSIS — G5760 Lesion of plantar nerve, unspecified lower limb: Secondary | ICD-10-CM

## 2017-07-20 NOTE — Progress Notes (Signed)
She presents today with a chief complaint of sharp shooting pain under the third and fourth toes of the right foot times past 5-6 months.  Objective: Vital signs are stable she is alert and oriented 3 pulses are palpable. She has pain on palpation to the thirdof the right foot. Radiographs demonstrate a diastasis between the third and fourth metatarsals and toes. This is consistent with neuroma. Palpable Mulder's click is noted.  Assessment: Neuroma third interdigital space right foot.  Plan: Injected the third interdigital space as Kenalog and local anesthetic discussed the possible needs for alcohol therapy.

## 2017-08-15 ENCOUNTER — Ambulatory Visit: Payer: Medicare HMO | Admitting: Podiatry

## 2017-08-15 ENCOUNTER — Ambulatory Visit (INDEPENDENT_AMBULATORY_CARE_PROVIDER_SITE_OTHER): Payer: Medicare HMO | Admitting: Sports Medicine

## 2017-08-15 ENCOUNTER — Encounter: Payer: Self-pay | Admitting: Sports Medicine

## 2017-08-15 DIAGNOSIS — M7751 Other enthesopathy of right foot: Secondary | ICD-10-CM

## 2017-08-15 DIAGNOSIS — M778 Other enthesopathies, not elsewhere classified: Secondary | ICD-10-CM

## 2017-08-15 DIAGNOSIS — G5760 Lesion of plantar nerve, unspecified lower limb: Secondary | ICD-10-CM | POA: Diagnosis not present

## 2017-08-15 DIAGNOSIS — M779 Enthesopathy, unspecified: Secondary | ICD-10-CM

## 2017-08-15 DIAGNOSIS — G5780 Other specified mononeuropathies of unspecified lower limb: Secondary | ICD-10-CM

## 2017-08-15 NOTE — Progress Notes (Signed)
Subjective: Tina Bray is a 66 y.o. female patient who returns to office for evaluation of right foot pain. Patient states that she was seen last month by Dr. Milinda Pointer and given an injection and it has NOT helped, pain came back in a few days. Pain is shooting type of pain with some numbness at the ball with pain under 3-4 toes on right. Patient denies any other issues.    Patient Active Problem List   Diagnosis Date Noted  . Chest pain 11/17/2016  . IBS (irritable bowel syndrome) 10/26/2015  . Anxiety 11/14/2011  . GERD (gastroesophageal reflux disease) 11/14/2011  . Allergic rhinitis 11/14/2011    Current Outpatient Medications on File Prior to Visit  Medication Sig Dispense Refill  . ALPRAZolam (XANAX) 0.5 MG tablet Use sparingly and take for panic symptoms only. 25 tablet 1  . b complex vitamins tablet Take 1 tablet by mouth daily.    . cholecalciferol (VITAMIN D) 1000 UNITS tablet Take 1,000 Units by mouth daily.    Marland Kitchen escitalopram (LEXAPRO) 20 MG tablet Take 1 tablet (20 mg total) by mouth daily. 90 tablet 3  . estradiol (ESTRACE) 0.1 MG/GM vaginal cream Place 0.2 applicator vaginally twice a week 42.5 g 11  . levocetirizine (XYZAL) 2.5 MG/5ML solution Take 2.5 mg by mouth every evening.    Marland Kitchen MEGARED OMEGA-3 KRILL OIL PO Take by mouth.    . meloxicam (MOBIC) 7.5 MG tablet Okay to take 2 tabs daily if pain poorly controlled with 1 tab. Call [facility] if you start having abdominal pain while taking this. (Patient not taking: Reported on 07/20/2017) 90 tablet 0  . Multiple Vitamin (MULTIVITAMIN) tablet Take 1 tablet by mouth daily.    . pantoprazole (PROTONIX) 40 MG tablet Take 1 tablet (40 mg total) by mouth daily. Take 30 minutes before breakfast daily. 90 tablet 3   No current facility-administered medications on file prior to visit.     Allergies  Allergen Reactions  . Erythromycin Anaphylaxis  . Demerol Other (See Comments)    blisters  . Penicillins Hives  . Sulfa  Antibiotics Hives    Objective:  General: Alert and oriented x3 in no acute distress  Dermatology: No open lesions bilateral lower extremities, no webspace macerations, no ecchymosis bilateral, all nails are well manicured.  Vascular: Dorsalis Pedis and Posterior Tibial pedal pulses palpable, Capillary Fill Time 3 seconds,(+) pedal hair growth bilateral, no edema bilateral lower extremities, Temperature gradient within normal limits.  Neurology: Gross sensation intact via light touch bilateral, (+) Mulder's sign at 3rd webspace on right.   Musculoskeletal: Mild tenderness with palpation at 3rd webspace on right foot,No pain with calf compression bilateral. + Bunion and fat pad distal migration bilateral, right greater than left.   Assessment and Plan: Problem List Items Addressed This Visit    None    Visit Diagnoses    Neuroma of third interspace of foot    -  Primary   Capsulitis of foot, right           -Complete examination performed -Discussed treatement options for neuroma right with secondary structural deformity  -After oral consent and aseptic prep, injected a mixture dehydrated alcohol into 3rd webspace on right without complication. Post-injection care discussed with patient.  -Dispensed metatarsal padding -Recommend rest, ice, and good supportive shoes -Patient to return to office 1-2 weeks for follow up evaluation with Dr. Milinda Pointer for possibly another dehydrated alcohol injection or sooner if condition worsens.  Landis Martins, DPM

## 2017-08-22 ENCOUNTER — Ambulatory Visit: Payer: Medicare HMO | Admitting: Podiatry

## 2017-08-22 ENCOUNTER — Encounter: Payer: Self-pay | Admitting: Podiatry

## 2017-08-22 DIAGNOSIS — G5780 Other specified mononeuropathies of unspecified lower limb: Secondary | ICD-10-CM

## 2017-08-22 DIAGNOSIS — G5760 Lesion of plantar nerve, unspecified lower limb: Secondary | ICD-10-CM

## 2017-08-22 NOTE — Progress Notes (Signed)
She presents today for follow-up of neuroma third interspace of the right foot.  She states that the cramping is better but I feel like walking on glass.  Objective: Vital signs are stable alert oriented x3 palpable Mulder's click third interdigital space of the right foot.  Pulses remain palpable.  No open lesions or wounds are noted.  Assessment: Neuroma third interdigital space of the right foot.  Plan: Injected another dose of dehydrated alcohol to the third interdigital space of the right foot today.  Follow-up with her in 2-3 weeks for another injection.

## 2017-09-07 ENCOUNTER — Ambulatory Visit: Payer: Medicare HMO | Admitting: Podiatry

## 2017-09-07 DIAGNOSIS — G5760 Lesion of plantar nerve, unspecified lower limb: Secondary | ICD-10-CM | POA: Diagnosis not present

## 2017-09-07 DIAGNOSIS — G5780 Other specified mononeuropathies of unspecified lower limb: Secondary | ICD-10-CM

## 2017-09-09 NOTE — Progress Notes (Signed)
She presents today for follow-up of her right foot neuroma third interdigital space.  She states that the pain is still there but seems to be doing better.  Objective: Vital signs are stable she is alert and oriented x3.  Pulses are palpable.  Still palpable Mulder's click to the third interdigital space of the right foot.  Assessment: Neuroma third interdigital space right foot.  Plan: Reinjected today dehydrated alcohol.  Follow-up with her in the near future for another injection.

## 2017-09-28 ENCOUNTER — Ambulatory Visit: Payer: Medicare HMO | Admitting: Podiatry

## 2017-09-28 ENCOUNTER — Encounter: Payer: Self-pay | Admitting: Podiatry

## 2017-09-28 DIAGNOSIS — G5780 Other specified mononeuropathies of unspecified lower limb: Secondary | ICD-10-CM

## 2017-09-28 DIAGNOSIS — G5760 Lesion of plantar nerve, unspecified lower limb: Secondary | ICD-10-CM | POA: Diagnosis not present

## 2017-09-28 NOTE — Progress Notes (Signed)
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  States that he seems to be doing better but I think we need to do just in 1 more shot possibly.  Objective: Vital signs are stable alert and oriented x3.  Pulses are palpable.  Palpable Mulder's click to the third interdigital space of the right foot is present.  Assessment: Neuroma third interdigital space right foot.  Plan: After sterile Betadine skin prep and verbal permission I injected another dose of dehydrated alcohol 4% to the point of maximal tenderness of her right foot.  I will follow-up with her in 3 weeks for reevaluation.

## 2017-10-19 ENCOUNTER — Ambulatory Visit: Payer: Medicare HMO | Admitting: Podiatry

## 2017-10-25 DIAGNOSIS — L820 Inflamed seborrheic keratosis: Secondary | ICD-10-CM | POA: Diagnosis not present

## 2017-10-25 DIAGNOSIS — L718 Other rosacea: Secondary | ICD-10-CM | POA: Diagnosis not present

## 2017-10-25 DIAGNOSIS — D3610 Benign neoplasm of peripheral nerves and autonomic nervous system, unspecified: Secondary | ICD-10-CM | POA: Diagnosis not present

## 2017-10-31 DIAGNOSIS — H40013 Open angle with borderline findings, low risk, bilateral: Secondary | ICD-10-CM | POA: Diagnosis not present

## 2017-11-07 ENCOUNTER — Encounter: Payer: Self-pay | Admitting: Physician Assistant

## 2017-11-07 ENCOUNTER — Ambulatory Visit (INDEPENDENT_AMBULATORY_CARE_PROVIDER_SITE_OTHER): Payer: Medicare HMO | Admitting: Physician Assistant

## 2017-11-07 ENCOUNTER — Other Ambulatory Visit: Payer: Self-pay

## 2017-11-07 VITALS — BP 130/84 | HR 78 | Temp 98.9°F | Resp 16 | Ht 63.0 in | Wt 131.0 lb

## 2017-11-07 DIAGNOSIS — J0110 Acute frontal sinusitis, unspecified: Secondary | ICD-10-CM | POA: Diagnosis not present

## 2017-11-07 DIAGNOSIS — M25532 Pain in left wrist: Secondary | ICD-10-CM | POA: Diagnosis not present

## 2017-11-07 DIAGNOSIS — M25531 Pain in right wrist: Secondary | ICD-10-CM

## 2017-11-07 MED ORDER — DOXYCYCLINE HYCLATE 100 MG PO TABS: 100.0000 mg | ORAL_TABLET | Freq: Two times a day (BID) | ORAL | 0 refills | Status: DC

## 2017-11-07 MED ORDER — MELOXICAM 7.5 MG PO TABS: 7.5000 mg | ORAL_TABLET | Freq: Every day | ORAL | 0 refills | Status: DC

## 2017-11-07 NOTE — Progress Notes (Signed)
Tina Bray  MRN: [id number] DOB: 02-02-1951  PCP: [name], MD  Chief Complaint  Patient presents with  . Sinusitis    right ear worse but both feel clogged for over a month and sinusis x 2 months     Subjective:  Pt presents to clinic for sinus problems for at least a month - seems to be worse on the right side - sharp pain in the right ear and then teeth pain and sinus pressure.  She has no rhinorrhea but has a lot of nasal congestion.  She is not having PND but does have a dry throat.  She has problems with allergies in the spring and fall but she is not thinking that she has not started with her allergies but when she is outside her nose does run.  She has been on allergy injections in the past.  She does use her netti pot daily.  Pt also wants to talk about her bilateral wrist pain which she has had carpal tunnel surgery for.  She takes tylenol prn but wants to know what else to do.  She has some numbness in her fingers - esp her index finger.  History is obtained by patient.  Review of Systems  Constitutional: Negative for chills and fever.  HENT: Positive for congestion, dental problem, sinus pain (right side), sneezing and sore throat (dry). Negative for postnasal drip.   Respiratory: Positive for cough.   Gastrointestinal: Negative.   Neurological: Positive for headaches.    Patient Active Problem List   Diagnosis Date Noted  . Chest pain 11/17/2016  . IBS (irritable bowel syndrome) 10/26/2015  . Anxiety 11/14/2011  . GERD (gastroesophageal reflux disease) 11/14/2011  . Allergic rhinitis 11/14/2011    Current Outpatient Medications on File Prior to Visit  Medication Sig Dispense Refill  . ALPRAZolam (XANAX) 0.5 MG tablet Use sparingly and take for panic symptoms only. 25 tablet 1  . b complex vitamins tablet Take 1 tablet by mouth daily.    . cholecalciferol (VITAMIN D) 1000 UNITS tablet Take 1,000 Units by mouth daily.    Marland Kitchen escitalopram  (LEXAPRO) 20 MG tablet Take 1 tablet (20 mg total) by mouth daily. 90 tablet 3  . estradiol (ESTRACE) 0.1 MG/GM vaginal cream Place 0.2 applicator vaginally twice a week 42.5 g 11  . fluticasone (FLONASE) 50 MCG/ACT nasal spray Place into both nostrils daily.    Marland Kitchen levocetirizine (XYZAL) 2.5 MG/5ML solution Take 2.5 mg by mouth every evening.    Marland Kitchen MEGARED OMEGA-3 KRILL OIL PO Take by mouth.    . Multiple Vitamin (MULTIVITAMIN) tablet Take 1 tablet by mouth daily.    . pantoprazole (PROTONIX) 40 MG tablet Take 1 tablet (40 mg total) by mouth daily. Take 30 minutes before breakfast daily. 90 tablet 3   No current facility-administered medications on file prior to visit.     Allergies  Allergen Reactions  . Erythromycin Anaphylaxis  . Demerol Other (See Comments)    blisters  . Penicillins Hives  . Sulfa Antibiotics Hives    Past Medical History:  Diagnosis Date  . Allergy   . Anxiety   . Asthma   . Depression   . GERD (gastroesophageal reflux disease)   . IBS (irritable bowel syndrome)   . Kidney stones   . Meningitis   . Osteopenia   . Pneumonia    Social History   Social History Narrative   Marital status:  Married x 14 years; happily  married.  No abuse.  Moved from [place].      Children:  2 daughters; 1 grandson.      Lives:  With husband.      Employment:  Sales promotion account executive.        Tobacco: none      Alcohol: 4 glasses of wine weekly.      Drugs:none      Exercise: yes; walking and running   Social History   Tobacco Use  . Smoking status: Never Smoker  . Smokeless tobacco: Never Used  Substance Use Topics  . Alcohol use: Yes    Alcohol/week: 2.4 oz    Types: 4 Glasses of wine per week  . Drug use: No   family history includes Alcohol abuse in her father; Arthritis in her brother; Diabetes in her mother; Heart disease (age of onset: 30) in her mother; Hypertension in her mother; Irritable bowel syndrome in her unknown relative; Lung cancer in her  mother; Stroke in her father.     Objective:  BP 130/84   Pulse 78   Temp 98.9 F (37.2 C)   Resp 16   Ht 5\' 3"  (1.6 m)   Wt 131 lb (59.4 kg)   SpO2 97%   BMI 23.21 kg/m  Body mass index is 23.21 kg/m.  Physical Exam  Constitutional: She is oriented to person, place, and time and well-developed, well-nourished, and in no distress.  HENT:  Head: Normocephalic and atraumatic.  Right Ear: Hearing, tympanic membrane, external ear and ear canal normal.  Left Ear: Hearing, tympanic membrane, external ear and ear canal normal.  Nose: Nose normal.  Mouth/Throat: Uvula is midline, oropharynx is clear and moist and mucous membranes are normal.  Eyes: Conjunctivae are normal.  Neck: Normal range of motion.  Cardiovascular: Normal rate, regular rhythm and normal heart sounds.  No murmur heard. Pulmonary/Chest: Effort normal and breath sounds normal.  Neurological: She is alert and oriented to person, place, and time. Gait normal.  Skin: Skin is warm and dry.  Psychiatric: Mood, memory, affect and judgment normal.  Vitals reviewed.   Assessment and Plan :  Acute non-recurrent frontal sinusitis - Plan: doxycycline (VIBRA-TABS) 100 MG tablet - pt should start her allergy medications.  Bilateral wrist pain - Plan: meloxicam (MOBIC) 7.5 MG tablet - pt can use this prn - she has had in the past that has helped other pains.  If this continues she should recheck with her PCP for evaluation and possible referral to ortho.  Windell Hummingbird PA-C  [facility] 11/07/2017 1:50 PM

## 2017-11-07 NOTE — Patient Instructions (Signed)
     IF you received an x-ray today, you will receive an invoice from [facility]. Please contact [facility] at [phone] with questions or concerns regarding your invoice.   IF you received labwork today, you will receive an invoice from [facility]. Please contact [facility] at [phone] with questions or concerns regarding your invoice.   Our billing staff will not be able to assist you with questions regarding bills from these companies.  You will be contacted with the lab results as soon as they are available. The fastest way to get your results is to activate your My Chart account. Instructions are located on the last page of this paperwork. If you have not heard from us regarding the results in 2 weeks, please contact this office.     

## 2017-11-14 ENCOUNTER — Telehealth: Payer: Self-pay | Admitting: Physician Assistant

## 2017-11-14 NOTE — Telephone Encounter (Signed)
Called pt to remind them of their appt tomorrow. Advised them of the time, building number, early arrival and late policy.

## 2017-11-15 ENCOUNTER — Telehealth: Payer: Self-pay | Admitting: Family Medicine

## 2017-11-15 ENCOUNTER — Encounter: Payer: Self-pay | Admitting: Physician Assistant

## 2017-11-15 ENCOUNTER — Ambulatory Visit (INDEPENDENT_AMBULATORY_CARE_PROVIDER_SITE_OTHER): Payer: Medicare HMO | Admitting: Physician Assistant

## 2017-11-15 ENCOUNTER — Other Ambulatory Visit: Payer: Self-pay

## 2017-11-15 VITALS — BP 128/70 | HR 68 | Temp 97.9°F | Resp 18 | Ht 62.21 in | Wt 129.6 lb

## 2017-11-15 DIAGNOSIS — Z13228 Encounter for screening for other metabolic disorders: Secondary | ICD-10-CM | POA: Diagnosis not present

## 2017-11-15 DIAGNOSIS — Z23 Encounter for immunization: Secondary | ICD-10-CM | POA: Diagnosis not present

## 2017-11-15 DIAGNOSIS — F410 Panic disorder [episodic paroxysmal anxiety] without agoraphobia: Secondary | ICD-10-CM

## 2017-11-15 DIAGNOSIS — Z0000 Encounter for general adult medical examination without abnormal findings: Secondary | ICD-10-CM | POA: Diagnosis not present

## 2017-11-15 DIAGNOSIS — F341 Dysthymic disorder: Secondary | ICD-10-CM

## 2017-11-15 DIAGNOSIS — Z1321 Encounter for screening for nutritional disorder: Secondary | ICD-10-CM | POA: Diagnosis not present

## 2017-11-15 DIAGNOSIS — K219 Gastro-esophageal reflux disease without esophagitis: Secondary | ICD-10-CM

## 2017-11-15 DIAGNOSIS — Z1329 Encounter for screening for other suspected endocrine disorder: Secondary | ICD-10-CM

## 2017-11-15 DIAGNOSIS — R69 Illness, unspecified: Secondary | ICD-10-CM | POA: Diagnosis not present

## 2017-11-15 DIAGNOSIS — Z130 Encounter for screening for diseases of the blood and blood-forming organs and certain disorders involving the immune mechanism: Secondary | ICD-10-CM

## 2017-11-15 MED ORDER — ALPRAZOLAM 0.5 MG PO TABS: ORAL_TABLET | ORAL | 1 refills | Status: DC

## 2017-11-15 MED ORDER — ESCITALOPRAM OXALATE 20 MG PO TABS: 20.0000 mg | ORAL_TABLET | Freq: Every day | ORAL | 3 refills | Status: DC

## 2017-11-15 MED ORDER — PANTOPRAZOLE SODIUM 40 MG PO TBEC: 40.0000 mg | DELAYED_RELEASE_TABLET | Freq: Every day | ORAL | 3 refills | Status: DC

## 2017-11-15 MED ORDER — PREDNISONE 20 MG PO TABS: ORAL_TABLET | ORAL | 0 refills | Status: AC

## 2017-11-15 NOTE — Patient Instructions (Signed)
     IF you received an x-ray today, you will receive an invoice from [facility]. Please contact  Radiology at [phone] with questions or concerns regarding your invoice.   IF you received labwork today, you will receive an invoice from [facility]. Please contact [facility] at [phone] with questions or concerns regarding your invoice.   Our billing staff will not be able to assist you with questions regarding bills from these companies.  You will be contacted with the lab results as soon as they are available. The fastest way to get your results is to activate your My Chart account. Instructions are located on the last page of this paperwork. If you have not heard from us regarding the results in 2 weeks, please contact this office.     

## 2017-11-15 NOTE — Progress Notes (Signed)
11/15/2017 8:48 AM   DOB: [birth year] / MRN: [id number]  SUBJECTIVE:  Tina Bray is a 67 y.o. female presenting for annual exam.  She enjoys exercising at the [employer] times a day and does classes.  She is a former smoker and quit a long time ago.  She is receiving GYN care at [facility].  They also monitor her bone density and her breast exams.  Last Pap was negative.  She does not know which pneumonia shots she has had.  She would like this updated today.  She did have a flu shot this year.  She is allergic to erythromycin; demerol; penicillins; and sulfa antibiotics.   She  has a past medical history of Allergy, Anxiety, Asthma, Depression, GERD (gastroesophageal reflux disease), IBS (irritable bowel syndrome), Kidney stones, Meningitis, Osteopenia, and Pneumonia.    She  reports that  has never smoked. she has never used smokeless tobacco. She reports that she drinks about 2.4 oz of alcohol per week. She reports that she does not use drugs. She  reports that she currently engages in sexual activity. She reports using the following method of birth control/protection: None. The patient  has a past surgical history that includes Appendectomy; Cholecystectomy; Cosmetic surgery; Tubal ligation; Carpal tunnel release; orthoscopic shoulder; Colonoscopy (09/05/1998); Tonsillectomy; Spine surgery; and Knee arthroscopy.  Her family history includes Alcohol abuse in her father; Arthritis in her brother; Cancer in her mother; Diabetes in her mother; Heart disease (age of onset: 15) in her mother; Hypertension in her mother; Irritable bowel syndrome in her unknown relative; Lung cancer in her mother; Stroke in her father.  Review of Systems  Constitutional: Negative for chills, diaphoresis and fever.  Eyes: Negative.   Respiratory: Negative for cough, hemoptysis, sputum production, shortness of breath and wheezing.   Cardiovascular: Negative for chest pain, orthopnea and leg swelling.    Gastrointestinal: Negative for abdominal pain, blood in stool, constipation, diarrhea, heartburn, melena, nausea and vomiting.  Genitourinary: Negative for dysuria, flank pain, frequency, hematuria and urgency.  Musculoskeletal: Positive for joint pain (Chronic).  Skin: Negative for rash.  Neurological: Negative for dizziness, sensory change, speech change, focal weakness and headaches.    The problem list and medications were reviewed and updated by myself where necessary and exist elsewhere in the encounter.   OBJECTIVE:  BP 128/70 (BP Location: Right Arm, Patient Position: Sitting, Cuff Size: Normal)   Pulse 68   Temp 97.9 F (36.6 C) (Oral)   Resp 18   Ht 5' 2.21" (1.58 m)   Wt 129 lb 9.6 oz (58.8 kg)   SpO2 96%   BMI 23.55 kg/m   Physical Exam  Constitutional: She is active.  Non-toxic appearance.  Cardiovascular: Normal rate, regular rhythm, S1 normal, S2 normal, normal heart sounds and intact distal pulses. Exam reveals no gallop, no friction rub and no decreased pulses.  No murmur heard. Pulmonary/Chest: Effort normal. No stridor. No tachypnea. No respiratory distress. She has no wheezes. She has no rales.  Abdominal: She exhibits no distension.  Musculoskeletal: She exhibits no edema.       Hands: Neurological: She is alert.  Skin: Skin is warm and dry. She is not diaphoretic. No pallor.    No results found for this or any previous visit (from the past 72 hour(s)).  No results found.  ASSESSMENT AND PLAN:  Tina Bray was seen today for annual exam.  Diagnoses and all orders for this visit:  Annual physical exam -  CYCLIC CITRUL PEPTIDE ANTIBODY, IGG/IGA  Screening for endocrine, nutritional, metabolic and immunity disorder -     CBC -     Lipid panel -     TSH -     Hemoglobin A1c -     Hepatitis C antibody -     Basic metabolic panel -     Hepatic function panel -     Ambulatory referral to Hand Surgery -     Cancel: Cyclic Citrul Peptide Antibody,  IGG -     Rheumatoid factor  Other orders -     predniSONE (DELTASONE) 20 MG tablet; Take 3 in the morning for 3 days, then 2 in the morning for 3 days, and then 1 in the morning for 3 days. -     Pneumococcal conjugate vaccine 13-valent IM    The patient is advised to call or return to clinic if she does not see an improvement in symptoms, or to seek the care of the closest emergency department if she worsens with the above plan.   Philis Fendt, MHS, PA-C [facility] 11/15/2017 8:48 AM

## 2017-11-15 NOTE — Telephone Encounter (Unsigned)
Copied from [place]. Topic: Quick Communication - Rx Refill/Question >> Nov 15, 2017  5:26 PM Neva Seat wrote: ALPRAZolam Duanne Moron) 0.5 MG tablet  Needing directions clarified for insurance purposes.  [facility] # [id number] [place] [address] [place] [address] Phone: [phone] Fax: [phone]

## 2017-11-16 LAB — HEPATITIS C ANTIBODY: Hep C Virus Ab: <0.1 s/co ratio (ref 0.0–0.9)

## 2017-11-16 LAB — CBC
HEMATOCRIT: 42 % (ref 34.0–46.6)
Hemoglobin: 14.3 g/dL (ref 11.1–15.9)
MCH: 29.9 pg (ref 26.6–33.0)
MCHC: 34 g/dL (ref 31.5–35.7)
MCV: 88 fL (ref 79–97)
Platelets: 220 10*3/uL (ref 150–379)
RBC: 4.79 x10E6/uL (ref 3.77–5.28)
RDW: 13.6 % (ref 12.3–15.4)
WBC: 3.3 10*3/uL — ABNORMAL LOW (ref 3.4–10.8)

## 2017-11-16 LAB — HEPATIC FUNCTION PANEL
ALK PHOS: 70 IU/L (ref 39–117)
ALT: 22 IU/L (ref 0–32)
AST: 27 IU/L (ref 0–40)
Albumin: 4.7 g/dL (ref 3.6–4.8)
BILIRUBIN TOTAL: 0.7 mg/dL (ref 0.0–1.2)
Bilirubin, Direct: 0.18 mg/dL (ref 0.00–0.40)
Total Protein: 6.6 g/dL (ref 6.0–8.5)

## 2017-11-16 LAB — LIPID PANEL
CHOL/HDL RATIO: 2.3 ratio (ref 0.0–4.4)
Cholesterol, Total: 187 mg/dL (ref 100–199)
HDL: 80 mg/dL (ref >39)
LDL Calculated: 97 mg/dL (ref 0–99)
Triglycerides: 48 mg/dL (ref 0–149)
VLDL Cholesterol Cal: 10 mg/dL (ref 5–40)

## 2017-11-16 LAB — BASIC METABOLIC PANEL
BUN/Creatinine Ratio: 26 (ref 12–28)
BUN: 20 mg/dL (ref 8–27)
CALCIUM: 9.7 mg/dL (ref 8.7–10.3)
CO2: 23 mmol/L (ref 20–29)
CREATININE: 0.77 mg/dL (ref 0.57–1.00)
Chloride: 103 mmol/L (ref 96–106)
GFR, EST AFRICAN AMERICAN: 92 mL/min/{1.73_m2} (ref >59)
GFR, EST NON AFRICAN AMERICAN: 80 mL/min/{1.73_m2} (ref >59)
Glucose: 83 mg/dL (ref 65–99)
POTASSIUM: 4.1 mmol/L (ref 3.5–5.2)
Sodium: 144 mmol/L (ref 134–144)

## 2017-11-16 LAB — RHEUMATOID FACTOR: Rhuematoid fact SerPl-aCnc: <10 IU/mL (ref 0.0–13.9)

## 2017-11-16 LAB — HEMOGLOBIN A1C
ESTIMATED AVERAGE GLUCOSE: 103 mg/dL
Hgb A1c MFr Bld: 5.2 % (ref 4.8–5.6)

## 2017-11-16 LAB — CYCLIC CITRUL PEPTIDE ANTIBODY, IGG/IGA: CYCLIC CITRULLIN PEPTIDE AB: 3 U (ref 0–19)

## 2017-11-16 LAB — TSH: TSH: 2.06 u[IU]/mL (ref 0.450–4.500)

## 2017-11-16 NOTE — Telephone Encounter (Signed)
ALPRAZolam (XANAX) 0.5 MG tablet  Needing directions clarified for insurance purposes.  [facility] # [address], [place] [address] [place] [address] Phone: [phone] Fax: [phone]

## 2017-11-21 NOTE — Telephone Encounter (Signed)
Spoke with pharmacy

## 2017-11-21 NOTE — Telephone Encounter (Signed)
Patient is calling for update. Please call pharmacy for clarification on this. PLEASE ADVISE. Called office and everyone is at lunch. Advised patient to call back.

## 2018-01-01 DIAGNOSIS — G5601 Carpal tunnel syndrome, right upper limb: Secondary | ICD-10-CM | POA: Diagnosis not present

## 2018-01-01 DIAGNOSIS — M13841 Other specified arthritis, right hand: Secondary | ICD-10-CM | POA: Diagnosis not present

## 2018-01-01 DIAGNOSIS — M25531 Pain in right wrist: Secondary | ICD-10-CM | POA: Diagnosis not present

## 2018-01-10 DIAGNOSIS — M8588 Other specified disorders of bone density and structure, other site: Secondary | ICD-10-CM | POA: Diagnosis not present

## 2018-01-10 DIAGNOSIS — Z01419 Encounter for gynecological examination (general) (routine) without abnormal findings: Secondary | ICD-10-CM | POA: Diagnosis not present

## 2018-01-10 DIAGNOSIS — Z6824 Body mass index (BMI) 24.0-24.9, adult: Secondary | ICD-10-CM | POA: Diagnosis not present

## 2018-01-10 DIAGNOSIS — N952 Postmenopausal atrophic vaginitis: Secondary | ICD-10-CM | POA: Diagnosis not present

## 2018-01-10 DIAGNOSIS — Z1231 Encounter for screening mammogram for malignant neoplasm of breast: Secondary | ICD-10-CM | POA: Diagnosis not present

## 2018-01-10 DIAGNOSIS — N958 Other specified menopausal and perimenopausal disorders: Secondary | ICD-10-CM | POA: Diagnosis not present

## 2018-01-22 DIAGNOSIS — G5601 Carpal tunnel syndrome, right upper limb: Secondary | ICD-10-CM | POA: Diagnosis not present

## 2018-01-30 ENCOUNTER — Encounter: Payer: Self-pay | Admitting: Family Medicine

## 2018-02-08 DIAGNOSIS — G5601 Carpal tunnel syndrome, right upper limb: Secondary | ICD-10-CM | POA: Diagnosis not present

## 2018-03-05 DIAGNOSIS — G5601 Carpal tunnel syndrome, right upper limb: Secondary | ICD-10-CM | POA: Diagnosis not present

## 2018-03-16 DIAGNOSIS — G5601 Carpal tunnel syndrome, right upper limb: Secondary | ICD-10-CM | POA: Diagnosis not present

## 2018-03-16 DIAGNOSIS — Z4789 Encounter for other orthopedic aftercare: Secondary | ICD-10-CM | POA: Diagnosis not present

## 2018-03-16 DIAGNOSIS — M25531 Pain in right wrist: Secondary | ICD-10-CM | POA: Diagnosis not present

## 2018-04-13 DIAGNOSIS — Z4789 Encounter for other orthopedic aftercare: Secondary | ICD-10-CM | POA: Diagnosis not present

## 2018-04-13 DIAGNOSIS — M1811 Unilateral primary osteoarthritis of first carpometacarpal joint, right hand: Secondary | ICD-10-CM | POA: Insufficient documentation

## 2018-04-13 DIAGNOSIS — G5601 Carpal tunnel syndrome, right upper limb: Secondary | ICD-10-CM | POA: Diagnosis not present

## 2018-04-13 DIAGNOSIS — M13841 Other specified arthritis, right hand: Secondary | ICD-10-CM | POA: Diagnosis not present

## 2018-05-03 DIAGNOSIS — H401131 Primary open-angle glaucoma, bilateral, mild stage: Secondary | ICD-10-CM | POA: Diagnosis not present

## 2018-06-16 DIAGNOSIS — R69 Illness, unspecified: Secondary | ICD-10-CM | POA: Diagnosis not present

## 2018-07-01 DIAGNOSIS — J019 Acute sinusitis, unspecified: Secondary | ICD-10-CM | POA: Diagnosis not present

## 2018-11-15 ENCOUNTER — Other Ambulatory Visit: Payer: Self-pay | Admitting: Physician Assistant

## 2018-11-15 DIAGNOSIS — F341 Dysthymic disorder: Secondary | ICD-10-CM

## 2018-11-21 ENCOUNTER — Other Ambulatory Visit: Payer: Self-pay | Admitting: Physician Assistant

## 2018-11-21 DIAGNOSIS — F410 Panic disorder [episodic paroxysmal anxiety] without agoraphobia: Secondary | ICD-10-CM

## 2018-11-21 DIAGNOSIS — F341 Dysthymic disorder: Secondary | ICD-10-CM

## 2018-11-22 ENCOUNTER — Other Ambulatory Visit: Payer: Self-pay | Admitting: Physician Assistant

## 2018-11-22 DIAGNOSIS — F341 Dysthymic disorder: Secondary | ICD-10-CM

## 2018-11-23 ENCOUNTER — Other Ambulatory Visit: Payer: Self-pay | Admitting: Physician Assistant

## 2018-11-23 ENCOUNTER — Telehealth: Payer: Self-pay | Admitting: Physician Assistant

## 2018-11-23 DIAGNOSIS — F410 Panic disorder [episodic paroxysmal anxiety] without agoraphobia: Secondary | ICD-10-CM

## 2018-11-23 DIAGNOSIS — F341 Dysthymic disorder: Secondary | ICD-10-CM

## 2018-11-23 DIAGNOSIS — K219 Gastro-esophageal reflux disease without esophagitis: Secondary | ICD-10-CM

## 2018-11-23 NOTE — Telephone Encounter (Signed)
Advised pt Dr Mitchel Honour is not comfortable prescribing to pt's he has not seen before so we not be able to refill protonix or xanax for her at this time.  Pt appreciative of call back. Dgaddy, CMA

## 2018-11-23 NOTE — Telephone Encounter (Signed)
Do not feel comfortable prescribing medication for patients I have never seen before.  Will not prescribe medication at this time.  Thanks.

## 2018-11-23 NOTE — Telephone Encounter (Signed)
Copied from [place] [id number]. Topic: Quick Communication - Rx Refill/Question >> Nov 23, 2018 12:41 PM Leward Quan A wrote: Medication: ALPRAZolam Duanne Moron) 0.5 MG tablet   Has the patient contacted their pharmacy? Yes.   (Agent: If no, request that the patient contact the pharmacy for the refill.) (Agent: If yes, when and what did the pharmacy advise?) [facility] # [address], [place] [phone] (Phone) [phone] (Fax)   Preferred Pharmacy (with phone number or street name):   Agent: Please be advised that RX refills may take up to 3 business days. We ask that you follow-up with your pharmacy.

## 2018-11-23 NOTE — Telephone Encounter (Signed)
Spoke with pt advised she was last seen on 11/15/17 with Carlis Abbott and will need an transfer of care appt with another provider.  Pt scheduled with [facility] for 01/01/2019 at 9:40 a.m,  pt agreeable but is requesting refill on protonix and xanax 0.5 mg.  I advised I will send message but it is highly likely that provider may not refill xanax 0.5 mg or protonix but I would send the message and let her know his decision.  Pt agreeable.  Dgaddy, CMA

## 2018-11-26 NOTE — Telephone Encounter (Signed)
Spoke with pt and advised her that Dr. Mitchel Honour feel uncomfortable prescribing these medication without OV, she verbalized understanding. Would you refill her Rx until her OV.

## 2018-11-26 NOTE — Telephone Encounter (Signed)
Pt left voice mail on refill line she also needs lexapro. Pt does not want to come in to office. Pt is on 30 day leave from work

## 2018-11-30 DIAGNOSIS — R69 Illness, unspecified: Secondary | ICD-10-CM | POA: Diagnosis not present

## 2018-11-30 DIAGNOSIS — Z760 Encounter for issue of repeat prescription: Secondary | ICD-10-CM | POA: Diagnosis not present

## 2018-11-30 DIAGNOSIS — K219 Gastro-esophageal reflux disease without esophagitis: Secondary | ICD-10-CM | POA: Diagnosis not present

## 2019-01-01 ENCOUNTER — Ambulatory Visit: Payer: Medicare HMO | Admitting: Emergency Medicine

## 2019-01-31 ENCOUNTER — Encounter: Payer: Self-pay | Admitting: Emergency Medicine

## 2019-01-31 ENCOUNTER — Ambulatory Visit (INDEPENDENT_AMBULATORY_CARE_PROVIDER_SITE_OTHER): Payer: Medicare HMO | Admitting: Emergency Medicine

## 2019-01-31 ENCOUNTER — Other Ambulatory Visit: Payer: Self-pay

## 2019-01-31 VITALS — BP 159/80 | HR 69 | Temp 98.2°F

## 2019-01-31 DIAGNOSIS — F341 Dysthymic disorder: Secondary | ICD-10-CM

## 2019-01-31 DIAGNOSIS — F410 Panic disorder [episodic paroxysmal anxiety] without agoraphobia: Secondary | ICD-10-CM | POA: Diagnosis not present

## 2019-01-31 DIAGNOSIS — Z23 Encounter for immunization: Secondary | ICD-10-CM

## 2019-01-31 DIAGNOSIS — H9201 Otalgia, right ear: Secondary | ICD-10-CM | POA: Diagnosis not present

## 2019-01-31 DIAGNOSIS — R69 Illness, unspecified: Secondary | ICD-10-CM | POA: Diagnosis not present

## 2019-01-31 DIAGNOSIS — Z7689 Persons encountering health services in other specified circumstances: Secondary | ICD-10-CM

## 2019-01-31 MED ORDER — ALPRAZOLAM 0.5 MG PO TABS: ORAL_TABLET | ORAL | 1 refills | Status: DC

## 2019-01-31 MED ORDER — ESCITALOPRAM OXALATE 20 MG PO TABS: 20.0000 mg | ORAL_TABLET | Freq: Every day | ORAL | 3 refills | Status: DC

## 2019-01-31 NOTE — Patient Instructions (Addendum)
If you have lab work done today you will be contacted with your lab results within the next 2 weeks.  If you have not heard from [place] then please contact us. The fastest way to get your results is to register for My Chart.   IF you received an x-ray today, you will receive an invoice from [facility]. Please contact [facility] at [phone] with questions or concerns regarding your invoice.   IF you received labwork today, you will receive an invoice from [facility]. Please contact [facility] at [phone] with questions or concerns regarding your invoice.   Our billing staff will not be able to assist you with questions regarding bills from these companies.  You will be contacted with the lab results as soon as they are available. The fastest way to get your results is to activate your My Chart account. Instructions are located on the last page of this paperwork. If you have not heard from [place] regarding the results in 2 weeks, please contact this office.      Living With Anxiety  After being diagnosed with an anxiety disorder, you may be relieved to know why you have felt or behaved a certain way. It is natural to also feel overwhelmed about the treatment ahead and what it will mean for your life. With care and support, you can manage this condition and recover from it. How to cope with anxiety Dealing with stress Stress is your body's reaction to life changes and events, both good and bad. Stress can last just a few hours or it can be ongoing. Stress can play a major role in anxiety, so it is important to learn both how to cope with stress and how to think about it differently. Talk with your health care provider or a counselor to learn more about stress reduction. He or she may suggest some stress reduction techniques, such as:  Music therapy. This can include creating or listening to music that you enjoy and that inspires you.  Mindfulness-based meditation. This  involves being aware of your normal breaths, rather than trying to control your breathing. It can be done while sitting or walking.  Centering prayer. This is a kind of meditation that involves focusing on a word, phrase, or sacred image that is meaningful to you and that brings you peace.  Deep breathing. To do this, expand your stomach and inhale slowly through your nose. Hold your breath for 3-5 seconds. Then exhale slowly, allowing your stomach muscles to relax.  Self-talk. This is a skill where you identify thought patterns that lead to anxiety reactions and correct those thoughts.  Muscle relaxation. This involves tensing muscles then relaxing them. Choose a stress reduction technique that fits your lifestyle and personality. Stress reduction techniques take time and practice. Set aside 5-15 minutes a day to do them. Therapists can offer training in these techniques. The training may be covered by some insurance plans. Other things you can do to manage stress include:  Keeping a stress diary. This can help you learn what triggers your stress and ways to control your response.  Thinking about how you respond to certain situations. You may not be able to control everything, but you can control your reaction.  Making time for activities that help you relax, and not feeling guilty about spending your time in this way. Therapy combined with coping and stress-reduction skills provides the best chance for successful treatment. Medicines Medicines can help ease symptoms. Medicines for anxiety  include:  Anti-anxiety drugs.  Antidepressants.  Beta-blockers. Medicines may be used as the main treatment for anxiety disorder, along with therapy, or if other treatments are not working. Medicines should be prescribed by a health care provider. Relationships Relationships can play a big part in helping you recover. Try to spend more time connecting with trusted friends and family members. Consider  going to couples counseling, taking family education classes, or going to family therapy. Therapy can help you and others better understand the condition. How to recognize changes in your condition Everyone has a different response to treatment for anxiety. Recovery from anxiety happens when symptoms decrease and stop interfering with your daily activities at home or work. This may mean that you will start to:  Have better concentration and focus.  Sleep better.  Be less irritable.  Have more energy.  Have improved memory. It is important to recognize when your condition is getting worse. Contact your health care provider if your symptoms interfere with home or work and you do not feel like your condition is improving. Where to find help and support: You can get help and support from these sources:  Self-help groups.  Online and [employer].  A trusted spiritual leader.  Couples counseling.  Family education classes.  Family therapy. Follow these instructions at home:  Eat a healthy diet that includes plenty of vegetables, fruits, whole grains, low-fat dairy products, and lean protein. Do not eat a lot of foods that are high in solid fats, added sugars, or salt.  Exercise. Most adults should do the following: ? Exercise for at least 150 minutes each week. The exercise should increase your heart rate and make you sweat (moderate-intensity exercise). ? Strengthening exercises at least twice a week.  Cut down on caffeine, tobacco, alcohol, and other potentially harmful substances.  Get the right amount and quality of sleep. Most adults need 7-9 hours of sleep each night.  Make choices that simplify your life.  Take over-the-counter and prescription medicines only as told by your health care provider.  Avoid caffeine, alcohol, and certain over-the-counter cold medicines. These may make you feel worse. Ask your pharmacist which medicines to avoid.  Keep all  follow-up visits as told by your health care provider. This is important. Questions to ask your health care provider  Would I benefit from therapy?  How often should I follow up with a health care provider?  How long do I need to take medicine?  Are there any long-term side effects of my medicine?  Are there any alternatives to taking medicine? Contact a health care provider if:  You have a hard time staying focused or finishing daily tasks.  You spend many hours a day feeling worried about everyday life.  You become exhausted by worry.  You start to have headaches, feel tense, or have nausea.  You urinate more than normal.  You have diarrhea. Get help right away if:  You have a racing heart and shortness of breath.  You have thoughts of hurting yourself or others. If you ever feel like you may hurt yourself or others, or have thoughts about taking your own life, get help right away. You can go to your nearest emergency department or call:  Your local emergency services (911 in the [place]  A suicide crisis helpline, such as the [facility] at 314-304-4348. This is open 24-hours a day. Summary  Taking steps to deal with stress can help calm you.  Medicines cannot cure  anxiety disorders, but they can help ease symptoms.  Family, friends, and partners can play a big part in helping you recover from an anxiety disorder. This information is not intended to replace advice given to you by your health care provider. Make sure you discuss any questions you have with your health care provider. Document Released: 08/16/2016 Document Revised: 08/16/2016 Document Reviewed: 08/16/2016 Elsevier Interactive Patient Education  2019 [employer].

## 2019-01-31 NOTE — Progress Notes (Signed)
Tina Bray 68 y.o.   Chief Complaint  Patient presents with   Establish Care    Patient is here today to establish care and to have leapro and xanax refilled for anxiety.   Ear Pain    Rt ear pain for the past several weeks on/off. Would like to have ear checked out    HISTORY OF PRESENT ILLNESS: This is a 68 y.o. female here to establish care with me.  Has a history of generalized anxiety disorder, has been on Lexapro for about 18 years, also uses Xanax sparingly for episodic panic attacks.  Requesting medication refill.  Otherwise healthy woman. Also complaining of pain to the right ear for the past 2 weeks.  Denies trauma.  No discharge or loss of hearing. HPI   Prior to Admission medications   Medication Sig Start Date End Date Taking? Authorizing Provider  ALPRAZolam Duanne Moron) 0.5 MG tablet Use sparingly and take for panic symptoms only. 01/31/19   Horald Pollen, MD  b complex vitamins tablet Take 1 tablet by mouth daily.    [provider]  BIOTIN 5000 PO Take by mouth daily.    [provider]  cholecalciferol (VITAMIN D) 1000 UNITS tablet Take 1,000 Units by mouth daily.    [provider]  escitalopram (LEXAPRO) 20 MG tablet Take 1 tablet (20 mg total) by mouth daily. 01/31/19 05/01/19  Horald Pollen, MD  estradiol (ESTRACE) 0.1 MG/GM vaginal cream Place 0.2 applicator vaginally twice a week 11/12/14   Copland, Gay Filler, MD  levocetirizine (XYZAL) 2.5 MG/5ML solution Take 2.5 mg by mouth every evening.    [provider]  Multiple Vitamin (MULTIVITAMIN) tablet Take 1 tablet by mouth daily.    [provider]  pantoprazole (PROTONIX) 40 MG tablet Take 1 tablet (40 mg total) by mouth daily. Take 30 minutes before breakfast daily. 11/15/17   Tereasa Coop, PA-C    Allergies  Allergen Reactions   Erythromycin Anaphylaxis   Demerol Other (See Comments)    blisters   Penicillins Hives   Sulfa  Antibiotics Hives    Patient Active Problem List   Diagnosis Date Noted   IBS (irritable bowel syndrome) 10/26/2015   Anxiety 11/14/2011   GERD (gastroesophageal reflux disease) 11/14/2011    Past Medical History:  Diagnosis Date   Allergy    Anxiety    Asthma    Depression    GERD (gastroesophageal reflux disease)    IBS (irritable bowel syndrome)    Kidney stones    Meningitis    Osteopenia    Pneumonia     Past Surgical History:  Procedure Laterality Date   APPENDECTOMY     CARPAL TUNNEL RELEASE     right and left wrist, right x 2   CHOLECYSTECTOMY     COLONOSCOPY  09/05/1998   normal; repeat in ten years.   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 on file   Food insecurity:    Worry: Not on file    Inability: Not on file   Transportation needs:    Medical: Not on file    Non-medical: Not on file  Tobacco Use   Smoking status: Never Smoker   Smokeless tobacco: Never Used  Substance and Sexual Activity   Alcohol use: Yes    Alcohol/week: 4.0 standard drinks    Types: 4 Glasses of wine per week   Drug use: No   Sexual activity: Yes    Birth control/protection: None  Lifestyle   Physical activity:    Days per week: Not on file    Minutes per session: Not on file   Stress: Not on file  Relationships   Social connections:    Talks on phone: Not on file    Gets together: Not on file    Attends religious service: Not on file    Active member of club or organization: Not on file    Attends meetings  of clubs or organizations: Not on file    Relationship status: Not on file   Intimate partner violence:    Fear of current or ex partner: Not on file    Emotionally abused: Not on file    Physically abused: Not on file    Forced sexual activity: Not on file  Other Topics Concern   Not on file  Social History Narrative   Marital status:  Married x 14 years; happily married.  No abuse.  Moved from [place].      Children:  2 daughters; 1 grandson.      Lives:  With husband.      Employment:  Sales promotion account executive.        Tobacco: none      Alcohol: 4 glasses of wine weekly.      Drugs:none      Exercise: yes; walking and running    Family History  Problem Relation Age of Onset   Heart disease Mother 13       cardiac stenting/CAD   Hypertension Mother    Lung cancer Mother    Diabetes Mother    Cancer Mother    Stroke Father    Alcohol abuse Father    Arthritis Brother    Irritable bowel syndrome Unknown        multiple family members     Review of Systems  Constitutional: Negative.  Negative for chills, fever and weight loss.  HENT: Positive for ear pain (Right ear). Negative for sore throat.   Eyes: Negative.  Negative for blurred vision and double vision.  Respiratory: Negative.  Negative for cough and shortness of breath.   Cardiovascular: Negative.  Negative for chest pain and palpitations.  Gastrointestinal: Negative.  Negative for abdominal pain, nausea and vomiting.  Genitourinary: Negative.  Negative for dysuria.  Skin: Negative.  Negative for rash.  Neurological: Negative.  Negative for dizziness and headaches.  Endo/Heme/Allergies: Negative.   Psychiatric/Behavioral: The patient is nervous/anxious.   All other systems reviewed and are negative.  Vitals:   01/31/19 1112  BP: (!) 159/80  Pulse: 69  Temp: 98.2 F (36.8 C)  SpO2: 98%      Physical Exam Vitals signs reviewed.  Constitutional:      Appearance: Normal appearance.    HENT:     Head: Normocephalic and atraumatic.     Right Ear: Tympanic membrane, ear canal and external ear normal.     Left  Ear: Tympanic membrane, ear canal and external ear normal.     Nose: Nose normal.  Eyes:     Extraocular Movements: Extraocular movements intact.     Conjunctiva/sclera: Conjunctivae normal.     Pupils: Pupils are equal, round, and reactive to light.  Neck:     Musculoskeletal: Normal range of motion and neck supple.  Cardiovascular:     Rate and Rhythm: Normal rate and regular rhythm.     Pulses: Normal pulses.     Heart sounds: Normal heart sounds.  Pulmonary:     Effort: Pulmonary effort is normal.  Musculoskeletal: Normal range of motion.  Skin:    General: Skin is warm and dry.     Capillary Refill: Capillary refill takes less than 2 seconds.  Neurological:     General: No focal deficit present.     Mental Status: She is alert and oriented to person, place, and time.  Psychiatric:        Mood and Affect: Mood normal.        Behavior: Behavior normal.      ASSESSMENT & PLAN: Tina Bray was seen today for establish care and ear pain.  Diagnoses and all orders for this visit:  Dysthymia Comments: Controlled. Continue current plan.  Orders: -     escitalopram (LEXAPRO) 20 MG tablet; Take 1 tablet (20 mg total) by mouth daily.  Panic attack -     ALPRAZolam (XANAX) 0.5 MG tablet; Use sparingly and take for panic symptoms only.  Need for prophylactic vaccination against Streptococcus pneumoniae (pneumococcus) -     Pneumococcal polysaccharide vaccine 23-valent greater than or equal to 2yo subcutaneous/IM  Acute otalgia, right Comments: Unclear etiology  Encounter to establish care     Patient Instructions       If you have lab work done today you will be contacted with your lab results within the next 2 weeks.  If you have not heard from [place] then please contact us. The fastest way to get your results is to register for My Chart.   IF  you received an x-ray today, you will receive an invoice from [facility]. Please contact [facility] at [phone] with questions or concerns regarding your invoice.   IF you received labwork today, you will receive an invoice from [facility]. Please contact [facility] at [phone] with questions or concerns regarding your invoice.   Our billing staff will not be able to assist you with questions regarding bills from these companies.  You will be contacted with the lab results as soon as they are available. The fastest way to get your results is to activate your My Chart account. Instructions are located on the last page of this paperwork. If you have not heard from [place] regarding the results in 2 weeks, please contact this office.      Living With Anxiety  After being diagnosed with an anxiety disorder, you may be relieved to know why you have felt or behaved a certain way. It is natural to also feel overwhelmed about the treatment ahead and what it will mean for your life. With care and support, you can manage this condition and recover from it. How to cope with anxiety Dealing with stress Stress is your bodys reaction to life changes and events, both good and bad. Stress can last just a few hours or it can be ongoing. Stress can play a major role in anxiety, so it is important to learn both how to cope with stress  and how to think about it differently. Talk with your health care provider or a counselor to learn more about stress reduction. He or she may suggest some stress reduction techniques, such as:  Music therapy. This can include creating or listening to music that you enjoy and that inspires you.  Mindfulness-based meditation. This involves being aware of your normal breaths, rather than trying to control your breathing. It can be done while sitting or walking.  Centering prayer. This is a kind of meditation that involves focusing on a word, phrase, or sacred image  that is meaningful to you and that brings you peace.  Deep breathing. To do this, expand your stomach and inhale slowly through your nose. Hold your breath for 3-5 seconds. Then exhale slowly, allowing your stomach muscles to relax.  Self-talk. This is a skill where you identify thought patterns that lead to anxiety reactions and correct those thoughts.  Muscle relaxation. This involves tensing muscles then relaxing them. Choose a stress reduction technique that fits your lifestyle and personality. Stress reduction techniques take time and practice. Set aside 5-15 minutes a day to do them. Therapists can offer training in these techniques. The training may be covered by some insurance plans. Other things you can do to manage stress include:  Keeping a stress diary. This can help you learn what triggers your stress and ways to control your response.  Thinking about how you respond to certain situations. You may not be able to control everything, but you can control your reaction.  Making time for activities that help you relax, and not feeling guilty about spending your time in this way. Therapy combined with coping and stress-reduction skills provides the best chance for successful treatment. Medicines Medicines can help ease symptoms. Medicines for anxiety include:  Anti-anxiety drugs.  Antidepressants.  Beta-blockers. Medicines may be used as the main treatment for anxiety disorder, along with therapy, or if other treatments are not working. Medicines should be prescribed by a health care provider. Relationships Relationships can play a big part in helping you recover. Try to spend more time connecting with trusted friends and family members. Consider going to couples counseling, taking family education classes, or going to family therapy. Therapy can help you and others better understand the condition. How to recognize changes in your condition Everyone has a different response to  treatment for anxiety. Recovery from anxiety happens when symptoms decrease and stop interfering with your daily activities at home or work. This may mean that you will start to:  Have better concentration and focus.  Sleep better.  Be less irritable.  Have more energy.  Have improved memory. It is important to recognize when your condition is getting worse. Contact your health care provider if your symptoms interfere with home or work and you do not feel like your condition is improving. Where to find help and support: You can get help and support from these sources:  Self-help groups.  Online and [employer].  A trusted spiritual leader.  Couples counseling.  Family education classes.  Family therapy. Follow these instructions at home:  Eat a healthy diet that includes plenty of vegetables, fruits, whole grains, low-fat dairy products, and lean protein. Do not eat a lot of foods that are high in solid fats, added sugars, or salt.  Exercise. Most adults should do the following: ? Exercise for at least 150 minutes each week. The exercise should increase your heart rate and make you sweat (moderate-intensity exercise). ? Strengthening exercises  at least twice a week.  Cut down on caffeine, tobacco, alcohol, and other potentially harmful substances.  Get the right amount and quality of sleep. Most adults need 7-9 hours of sleep each night.  Make choices that simplify your life.  Take over-the-counter and prescription medicines only as told by your health care provider.  Avoid caffeine, alcohol, and certain over-the-counter cold medicines. These may make you feel worse. Ask your pharmacist which medicines to avoid.  Keep all follow-up visits as told by your health care provider. This is important. Questions to ask your health care provider  Would I benefit from therapy?  How often should I follow up with a health care provider?  How long do I need to take  medicine?  Are there any long-term side effects of my medicine?  Are there any alternatives to taking medicine? Contact a health care provider if:  You have a hard time staying focused or finishing daily tasks.  You spend many hours a day feeling worried about everyday life.  You become exhausted by worry.  You start to have headaches, feel tense, or have nausea.  You urinate more than normal.  You have diarrhea. Get help right away if:  You have a racing heart and shortness of breath.  You have thoughts of hurting yourself or others. If you ever feel like you may hurt yourself or others, or have thoughts about taking your own life, get help right away. You can go to your nearest emergency department or call:  Your local emergency services (911 in the [place]  A suicide crisis helpline, such as the [facility] at 361-381-3467. This is open 24-hours a day. Summary  Taking steps to deal with stress can help calm you.  Medicines cannot cure anxiety disorders, but they can help ease symptoms.  Family, friends, and partners can play a big part in helping you recover from an anxiety disorder. This information is not intended to replace advice given to you by your health care provider. Make sure you discuss any questions you have with your health care provider. Document Released: 08/16/2016 Document Revised: 08/16/2016 Document Reviewed: 08/16/2016 Elsevier Interactive Patient Education  2019 [employer]      Agustina Caroli, MD [facility]

## 2019-02-12 ENCOUNTER — Other Ambulatory Visit: Payer: Self-pay | Admitting: Emergency Medicine

## 2019-02-12 MED ORDER — ALPRAZOLAM 0.5 MG PO TABS: 0.5000 mg | ORAL_TABLET | Freq: Every day | ORAL | 1 refills | Status: DC | PRN

## 2019-02-12 NOTE — Telephone Encounter (Signed)
TOC to Dr. Mitchel Honour 01/31/2019

## 2019-02-14 DIAGNOSIS — H524 Presbyopia: Secondary | ICD-10-CM | POA: Diagnosis not present

## 2019-02-14 DIAGNOSIS — H40013 Open angle with borderline findings, low risk, bilateral: Secondary | ICD-10-CM | POA: Diagnosis not present

## 2019-02-14 DIAGNOSIS — H52223 Regular astigmatism, bilateral: Secondary | ICD-10-CM | POA: Diagnosis not present

## 2019-02-14 DIAGNOSIS — H5213 Myopia, bilateral: Secondary | ICD-10-CM | POA: Diagnosis not present

## 2019-02-14 DIAGNOSIS — H2513 Age-related nuclear cataract, bilateral: Secondary | ICD-10-CM | POA: Diagnosis not present

## 2019-04-05 DIAGNOSIS — G5601 Carpal tunnel syndrome, right upper limb: Secondary | ICD-10-CM | POA: Diagnosis not present

## 2019-04-05 DIAGNOSIS — M13841 Other specified arthritis, right hand: Secondary | ICD-10-CM | POA: Diagnosis not present

## 2019-06-23 DIAGNOSIS — N390 Urinary tract infection, site not specified: Secondary | ICD-10-CM | POA: Diagnosis not present

## 2019-06-23 DIAGNOSIS — R69 Illness, unspecified: Secondary | ICD-10-CM | POA: Diagnosis not present

## 2019-07-09 DIAGNOSIS — M79642 Pain in left hand: Secondary | ICD-10-CM | POA: Diagnosis not present

## 2019-07-09 DIAGNOSIS — M13841 Other specified arthritis, right hand: Secondary | ICD-10-CM | POA: Diagnosis not present

## 2019-07-09 DIAGNOSIS — G5601 Carpal tunnel syndrome, right upper limb: Secondary | ICD-10-CM | POA: Diagnosis not present

## 2019-07-09 DIAGNOSIS — M13842 Other specified arthritis, left hand: Secondary | ICD-10-CM | POA: Diagnosis not present

## 2019-07-22 DIAGNOSIS — M79642 Pain in left hand: Secondary | ICD-10-CM | POA: Diagnosis not present

## 2019-07-30 DIAGNOSIS — M79642 Pain in left hand: Secondary | ICD-10-CM | POA: Diagnosis not present

## 2019-08-15 ENCOUNTER — Ambulatory Visit: Payer: Medicare HMO | Admitting: Emergency Medicine

## 2019-08-20 ENCOUNTER — Ambulatory Visit (INDEPENDENT_AMBULATORY_CARE_PROVIDER_SITE_OTHER): Payer: Medicare HMO | Admitting: Emergency Medicine

## 2019-08-20 ENCOUNTER — Other Ambulatory Visit: Payer: Self-pay

## 2019-08-20 ENCOUNTER — Encounter: Payer: Self-pay | Admitting: Emergency Medicine

## 2019-08-20 VITALS — BP 134/76 | HR 68 | Temp 97.4°F | Resp 16 | Ht 62.0 in | Wt 131.8 lb

## 2019-08-20 DIAGNOSIS — M2550 Pain in unspecified joint: Secondary | ICD-10-CM

## 2019-08-20 NOTE — Progress Notes (Signed)
Thayer Headings L Scales Outlaw 68 y.o.   Chief Complaint  Patient presents with  . Joint Swelling    x 2 months per patient pain thumbs, shoulders, right hip, left knee and right ankle    HISTORY OF PRESENT ILLNESS: This is a 68 y.o. female complaining of pain to multiple joints for several months, in particular joints that have been affected by trauma in the past few years.  Mostly right shoulder, left knee, and right ankle.  Has seen orthopedist in the past.  Denies any other associated symptoms.  Has never seen a rheumatologist in the past.  HPI   Prior to Admission medications   Medication Sig Start Date End Date Taking? Authorizing Provider  ALPRAZolam Duanne Moron) 0.5 MG tablet Take 1 tablet (0.5 mg total) by mouth daily as needed for anxiety. Use sparingly and take for panic symptoms only. 02/12/19  Yes SagardiaInes Bloomer, MD  b complex vitamins tablet Take 1 tablet by mouth daily.   Yes [provider]  BIOTIN 5000 PO Take by mouth daily.   Yes [provider]  cholecalciferol (VITAMIN D) 1000 UNITS tablet Take 1,000 Units by mouth daily.   Yes [provider]  estradiol (ESTRACE) 0.1 MG/GM vaginal cream Place 0.2 applicator vaginally twice a week 11/12/14  Yes Copland, Gay Filler, MD  levocetirizine (XYZAL) 2.5 MG/5ML solution Take 2.5 mg by mouth every evening.   Yes [provider]  Multiple Vitamin (MULTIVITAMIN) tablet Take 1 tablet by mouth daily.   Yes [provider]  pantoprazole (PROTONIX) 40 MG tablet Take 1 tablet (40 mg total) by mouth daily. Take 30 minutes before breakfast daily. 11/15/17  Yes Tereasa Coop, PA-C  escitalopram (LEXAPRO) 20 MG tablet Take 1 tablet (20 mg total) by mouth daily. 01/31/19 05/01/19  Horald Pollen, MD    Allergies  Allergen Reactions  . Erythromycin Anaphylaxis  . Demerol Other (See Comments)    blisters  . Penicillins Hives  . Sulfa Antibiotics Hives    Patient Active Problem List   Diagnosis Date Noted  . IBS (irritable bowel syndrome) 10/26/2015  . Anxiety 11/14/2011  . GERD (gastroesophageal reflux disease) 11/14/2011    Past Medical History:  Diagnosis Date  . Allergy   . Anxiety   . Asthma   . Depression   . GERD (gastroesophageal reflux disease)   . IBS (irritable bowel syndrome)   . Kidney stones   . Meningitis   . Osteopenia   . Pneumonia     Past Surgical History:  Procedure Laterality Date  . APPENDECTOMY    . CARPAL TUNNEL RELEASE     right and left wrist, right x 2  . CHOLECYSTECTOMY    . COLONOSCOPY  09/05/1998   normal; repeat in ten years.  Marland Kitchen COSMETIC SURGERY     eyelids  . KNEE ARTHROSCOPY    . orthoscopic shoulder     right shoulder  . SPINE SURGERY     cervical spine C5-6  . TONSILLECTOMY    . TUBAL LIGATION      Social History   Socioeconomic History  . Marital status: Married    Spouse name: Not on file  . Number of children: 2  . Years of education: Not on file  . Highest education level: Not on file  Occupational History  . Occupation: retired  Tobacco Use  . Smoking status: Never Smoker  . Smokeless tobacco: Never Used  Substance and Sexual Activity  . Alcohol use: Yes  Alcohol/week: 4.0 standard drinks    Types: 4 Glasses of wine per week  . Drug use: No  . Sexual activity: Yes    Birth control/protection: None  Other Topics Concern  . Not on file  Social History Narrative   Marital status:  Married x 14 years; happily married.  No abuse.  Moved from [place].      Children:  2 daughters; 1 grandson.      Lives:  With husband.      Employment:  Sales promotion account executive.        Tobacco: none      Alcohol: 4 glasses of wine weekly.      Drugs:none      Exercise: yes; walking and running   Social Determinants of Health   Financial Resource Strain:   . Difficulty of Paying Living Expenses: Not on file  Food Insecurity:   . Worried About Charity fundraiser in the Last Year: Not on file  . Ran  Out of Food in the Last Year: Not on file  Transportation Needs:   . Lack of Transportation (Medical): Not on file  . Lack of Transportation (Non-Medical): Not on file  Physical Activity:   . Days of Exercise per Week: Not on file  . Minutes of Exercise per Session: Not on file  Stress:   . Feeling of Stress : Not on file  Social Connections:   . Frequency of Communication with Friends and Family: Not on file  . Frequency of Social Gatherings with Friends and Family: Not on file  . Attends Religious Services: Not on file  . Active Member of Clubs or Organizations: Not on file  . Attends Archivist Meetings: Not on file  . Marital Status: Not on file  Intimate Partner Violence:   . Fear of Current or Ex-Partner: Not on file  . Emotionally Abused: Not on file  . Physically Abused: Not on file  . Sexually Abused: Not on file    Family History  Problem Relation Age of Onset  . Heart disease Mother 52       cardiac stenting/CAD  . Hypertension Mother   . Lung cancer Mother   . Diabetes Mother   . Cancer Mother   . Stroke Father   . Alcohol abuse Father   . Arthritis Brother   . Irritable bowel syndrome Unknown        multiple family members     Review of Systems  Constitutional: Negative.  Negative for chills and fever.  HENT: Negative for congestion and sore throat.   Respiratory: Negative.  Negative for cough and shortness of breath.   Cardiovascular: Negative for chest pain and palpitations.  Gastrointestinal: Negative.  Negative for abdominal pain, blood in stool, diarrhea, nausea and vomiting.  Genitourinary: Negative.  Negative for dysuria and flank pain.  Musculoskeletal: Positive for joint pain.  Skin: Negative.  Negative for rash.  Neurological: Negative for dizziness and headaches.     Physical Exam Vitals reviewed.  Constitutional:      Appearance: Normal appearance.  HENT:     Head: Normocephalic.  Eyes:     Extraocular Movements:  Extraocular movements intact.     Pupils: Pupils are equal, round, and reactive to light.  Cardiovascular:     Rate and Rhythm: Normal rate and regular rhythm.     Pulses: Normal pulses.     Heart sounds: Normal heart sounds.  Pulmonary:     Effort: Pulmonary effort is  normal.     Breath sounds: Normal breath sounds.  Abdominal:     Palpations: Abdomen is soft.     Tenderness: There is no abdominal tenderness.  Musculoskeletal:        General: No swelling, tenderness or deformity. Normal range of motion.     Cervical back: Normal, normal range of motion and neck supple.     Thoracic back: Normal.     Lumbar back: Normal.     Right lower leg: No edema.     Left lower leg: No edema.  Skin:    General: Skin is warm and dry.     Capillary Refill: Capillary refill takes less than 2 seconds.  Neurological:     General: No focal deficit present.     Mental Status: She is alert and oriented to person, place, and time.   A total of 25 minutes was spent in the room with the patient, greater than 50% of which was in counseling/coordination of care regarding differential diagnosis of multiple joint pains, management and treatment, medications choices, importance of physical activity as well as nutrition, need for rheumatologic evaluation, prognosis, and need for follow-up.    ASSESSMENT & PLAN: Tina Bray was seen today for joint swelling.  Diagnoses and all orders for this visit:  Arthralgia of multiple joints -     Ambulatory referral to Rheumatology    Patient Instructions       If you have lab work done today you will be contacted with your lab results within the next 2 weeks.  If you have not heard from [place] then please contact us. The fastest way to get your results is to register for My Chart.   IF you received an x-ray today, you will receive an invoice from [facility]. Please contact [facility] at [phone] with questions or concerns regarding your  invoice.   IF you received labwork today, you will receive an invoice from [place]. Please contact [facility] at [phone] with questions or concerns regarding your invoice.   Our billing staff will not be able to assist you with questions regarding bills from these companies.  You will be contacted with the lab results as soon as they are available. The fastest way to get your results is to activate your My Chart account. Instructions are located on the last page of this paperwork. If you have not heard from [place] regarding the results in 2 weeks, please contact this office.     Joint Pain  Joint pain can be caused by many things. It is likely to go away if you follow instructions from your doctor for taking care of yourself at home. Sometimes, you may need more treatment. Follow these instructions at home: Managing pain, stiffness, and swelling   If told, put ice on the painful area. ? Put ice in a plastic bag. ? Place a towel between your skin and the bag. ? Leave the ice on for 20 minutes, 2-3 times a day.  If told, put heat on the painful area. Do this as often as told by your doctor. Use the heat source that your doctor recommends, such as a moist heat pack or a heating pad. ? Place a towel between your skin and the heat source. ? Leave the heat on for 20-30 minutes. ? Take off the heat if your skin gets bright red. This is especially important if you are unable to feel pain, heat, or cold. You may have a greater risk of getting  burned.  Move your fingers or toes below the painful joint often. This helps with stiffness and swelling.  If possible, raise (elevate) the painful joint above the level of your heart while you are sitting or lying down. To do this, try putting a few pillows under the painful joint. Activity  Rest the painful joint for as long as told. Do not do things that cause pain or make your pain worse.  Begin exercising or stretching the affected area, as told by  your doctor. Ask your doctor what types of exercise are safe for you. If you have an elastic bandage, sling, or splint:  Wear the device as told by your doctor. Take it off only as told by your doctor.  Loosen the device if your fingers or toes below the joint: ? Tingle. ? Lose feeling (get numb). ? Get cold and blue.  Keep the device clean.  Ask your doctor if you should take off the device before bathing. You may need to cover it with a watertight covering when you take a bath or a shower. General instructions  Take over-the-counter and prescription medicines only as told by your doctor.  Do not use any products that contain nicotine or tobacco. These include cigarettes and e-cigarettes. If you need help quitting, ask your doctor.  Keep all follow-up visits as told by your doctor. This is important. Contact a doctor if:  You have pain that gets worse and does not get better with medicine.  Your joint pain does not get better in 3 days.  You have more bruising or swelling.  You have a fever.  You lose 10 lb (4.5 kg) or more without trying. Get help right away if:  You cannot move the joint.  Your fingers or toes tingle, lose feeling, or get cold and blue.  You have a fever along with a joint that is red, warm, and swollen. Summary  Joint pain can be caused by many things. It often goes away if you follow instructions from your doctor for taking care of yourself at home.  Rest the painful joint for as long as told. Do not do things that cause pain or make your pain worse.  Take over-the-counter and prescription medicines only as told by your doctor. This information is not intended to replace advice given to you by your health care provider. Make sure you discuss any questions you have with your health care provider. Document Released: 08/10/2009 Document Revised: 08/04/2017 Document Reviewed: 06/07/2017 Elsevier Patient Education  2020 [employer]vier Inc.      Agustina Caroli, MD [facility]

## 2019-08-20 NOTE — Patient Instructions (Addendum)
If you have lab work done today you will be contacted with your lab results within the next 2 weeks.  If you have not heard from [place] then please contact us. The fastest way to get your results is to register for My Chart.   IF you received an x-ray today, you will receive an invoice from [facility]. Please contact [facility] at [phone] with questions or concerns regarding your invoice.   IF you received labwork today, you will receive an invoice from [place]. Please contact [facility] at [phone] with questions or concerns regarding your invoice.   Our billing staff will not be able to assist you with questions regarding bills from these companies.  You will be contacted with the lab results as soon as they are available. The fastest way to get your results is to activate your My Chart account. Instructions are located on the last page of this paperwork. If you have not heard from [place] regarding the results in 2 weeks, please contact this office.     Joint Pain  Joint pain can be caused by many things. It is likely to go away if you follow instructions from your doctor for taking care of yourself at home. Sometimes, you may need more treatment. Follow these instructions at home: Managing pain, stiffness, and swelling   If told, put ice on the painful area. ? Put ice in a plastic bag. ? Place a towel between your skin and the bag. ? Leave the ice on for 20 minutes, 2-3 times a day.  If told, put heat on the painful area. Do this as often as told by your doctor. Use the heat source that your doctor recommends, such as a moist heat pack or a heating pad. ? Place a towel between your skin and the heat source. ? Leave the heat on for 20-30 minutes. ? Take off the heat if your skin gets bright red. This is especially important if you are unable to feel pain, heat, or cold. You may have a greater risk of getting burned.  Move your fingers or toes below the  painful joint often. This helps with stiffness and swelling.  If possible, raise (elevate) the painful joint above the level of your heart while you are sitting or lying down. To do this, try putting a few pillows under the painful joint. Activity  Rest the painful joint for as long as told. Do not do things that cause pain or make your pain worse.  Begin exercising or stretching the affected area, as told by your doctor. Ask your doctor what types of exercise are safe for you. If you have an elastic bandage, sling, or splint:  Wear the device as told by your doctor. Take it off only as told by your doctor.  Loosen the device if your fingers or toes below the joint: ? Tingle. ? Lose feeling (get numb). ? Get cold and blue.  Keep the device clean.  Ask your doctor if you should take off the device before bathing. You may need to cover it with a watertight covering when you take a bath or a shower. General instructions  Take over-the-counter and prescription medicines only as told by your doctor.  Do not use any products that contain nicotine or tobacco. These include cigarettes and e-cigarettes. If you need help quitting, ask your doctor.  Keep all follow-up visits as told by your doctor. This is important. Contact a doctor if:  You have  pain that gets worse and does not get better with medicine.  Your joint pain does not get better in 3 days.  You have more bruising or swelling.  You have a fever.  You lose 10 lb (4.5 kg) or more without trying. Get help right away if:  You cannot move the joint.  Your fingers or toes tingle, lose feeling, or get cold and blue.  You have a fever along with a joint that is red, warm, and swollen. Summary  Joint pain can be caused by many things. It often goes away if you follow instructions from your doctor for taking care of yourself at home.  Rest the painful joint for as long as told. Do not do things that cause pain or make your  pain worse.  Take over-the-counter and prescription medicines only as told by your doctor. This information is not intended to replace advice given to you by your health care provider. Make sure you discuss any questions you have with your health care provider. Document Released: 08/10/2009 Document Revised: 08/04/2017 Document Reviewed: 06/07/2017 Elsevier Patient Education  2020 [employer].

## 2019-08-21 DIAGNOSIS — L570 Actinic keratosis: Secondary | ICD-10-CM | POA: Diagnosis not present

## 2019-08-21 DIAGNOSIS — L820 Inflamed seborrheic keratosis: Secondary | ICD-10-CM | POA: Diagnosis not present

## 2019-08-21 DIAGNOSIS — D1801 Hemangioma of skin and subcutaneous tissue: Secondary | ICD-10-CM | POA: Diagnosis not present

## 2019-08-21 DIAGNOSIS — D485 Neoplasm of uncertain behavior of skin: Secondary | ICD-10-CM | POA: Diagnosis not present

## 2019-08-21 DIAGNOSIS — L718 Other rosacea: Secondary | ICD-10-CM | POA: Diagnosis not present

## 2019-08-21 DIAGNOSIS — L821 Other seborrheic keratosis: Secondary | ICD-10-CM | POA: Diagnosis not present

## 2019-09-18 ENCOUNTER — Telehealth: Payer: Self-pay | Admitting: *Deleted

## 2019-09-18 NOTE — Telephone Encounter (Signed)
Schedule AWV.  

## 2019-10-27 ENCOUNTER — Ambulatory Visit: Payer: Medicare HMO | Attending: Internal Medicine

## 2019-10-27 ENCOUNTER — Ambulatory Visit: Payer: Self-pay

## 2019-10-27 DIAGNOSIS — Z23 Encounter for immunization: Secondary | ICD-10-CM | POA: Insufficient documentation

## 2019-10-27 NOTE — Progress Notes (Signed)
   Covid-19 Vaccination Clinic  Name:  Tina Bray    MRN: [id number] DOB: [birth year]  10/27/2019  Tina Bray was observed post Covid-19 immunization for 15 minutes without incidence. She was provided with Vaccine Information Sheet and instruction to access the V-Safe system.   Tina Bray was instructed to call 911 with any severe reactions post vaccine: Marland Kitchen Difficulty breathing  . Swelling of your face and throat  . A fast heartbeat  . A bad rash all over your body  . Dizziness and weakness    Immunizations Administered    Name Date Dose VIS Date Route   Pfizer COVID-19 Vaccine 10/27/2019 11:09 AM 0.3 mL 08/16/2019 Intramuscular   Manufacturer: [place]   Lot: [id number]   [place]: [id number]

## 2019-11-08 DIAGNOSIS — H40013 Open angle with borderline findings, low risk, bilateral: Secondary | ICD-10-CM | POA: Diagnosis not present

## 2019-11-20 ENCOUNTER — Ambulatory Visit: Payer: Medicare HMO | Attending: Internal Medicine

## 2019-11-20 DIAGNOSIS — Z23 Encounter for immunization: Secondary | ICD-10-CM

## 2019-11-20 NOTE — Progress Notes (Signed)
   Covid-19 Vaccination Clinic  Name:  Tina Bray    MRN: [id number] DOB: Jan 19, 1951  11/20/2019  Tina Bray was observed post Covid-19 immunization for 30 minutes based on pre-vaccination screening without incident. She was provided with Vaccine Information Sheet and instruction to access the V-Safe system.   Tina Bray was instructed to call 911 with any severe reactions post vaccine: Marland Kitchen Difficulty breathing  . Swelling of face and throat  . A fast heartbeat  . A bad rash all over body  . Dizziness and weakness   Immunizations Administered    Name Date Dose VIS Date Route   Pfizer COVID-19 Vaccine 11/20/2019  1:36 PM 0.3 mL 08/16/2019 Intramuscular   Manufacturer: [place]   Lot: [id number]   [place]: [id number]

## 2019-11-21 DIAGNOSIS — H40013 Open angle with borderline findings, low risk, bilateral: Secondary | ICD-10-CM | POA: Diagnosis not present

## 2019-11-24 DIAGNOSIS — R21 Rash and other nonspecific skin eruption: Secondary | ICD-10-CM | POA: Diagnosis not present

## 2019-11-24 DIAGNOSIS — T50Z95A Adverse effect of other vaccines and biological substances, initial encounter: Secondary | ICD-10-CM | POA: Diagnosis not present

## 2019-11-29 NOTE — Progress Notes (Signed)
Office Visit Note  Patient: Tina Bray             Date of Birth: 12-01-1950           MRN: [id number]             PCP: Horald Pollen, MD Referring: Horald Pollen, * Visit Date: 12/04/2019 Occupation: @GUAROCC @  Subjective:  Pain in multiple joints.   History of Present Illness: Tina Bray is a 69 y.o. female seen in consultation per request of her PCP.  According to patient she has had history of disc disease of cervical spine for which she had C5-C6 fusion in 1996.  She also injured her right shoulder joint at work and developed frozen shoulder which lasted for couple of years in the 63s and then improved.  She also had right rotator cuff tear repair in 94.  She recalls having left knee joint injury in 90s as well for which she required arthroscopic surgery.  She states she has had recurrent carpal tunnel syndrome and had 3 surgeries on her right wrist the last one was in 2019.  She is also noticed that she has arthritis in her bilateral hands.  She has bilateral CMC arthritis the right has been more painful.  She also describes discomfort over her DIP joints and right third trigger finger.  She has had cortisone injections in her right [facility] joint x2 and the last one was in July 2020.  She has had right elbow joint fracture in the past which causes discomfort sometimes.  She also has discomfort in her cervical spine.  She has been experiencing pain over her right hip which she describes over the trochanteric bursa for the last 6 months.  She is also had history of right ankle fracture in the past which causes discomfort off and on.  She notices swelling in her right hand.  She is very active.  She does gardening and painting.  Activities of Daily Living:  Patient reports morning stiffness for several hours.   Patient Reports nocturnal pain.  Difficulty dressing/grooming: Denies Difficulty climbing stairs: Reports Difficulty getting out of chair:  Denies Difficulty using hands for taps, buttons, cutlery, and/or writing: Reports  Review of Systems  Constitutional: Positive for fatigue. Negative for night sweats, weight gain and weight loss.  HENT: Negative for mouth sores, trouble swallowing, trouble swallowing, mouth dryness and nose dryness.   Eyes: Positive for dryness. Negative for pain, redness, itching and visual disturbance.  Respiratory: Negative for cough, shortness of breath, wheezing and difficulty breathing.   Cardiovascular: Negative for chest pain, palpitations, hypertension, irregular heartbeat and swelling in legs/feet.  Gastrointestinal: Positive for constipation and diarrhea. Negative for blood in stool.       History of IBS  Endocrine: Negative for increased urination.  Genitourinary: Negative for difficulty urinating, painful urination and vaginal dryness.  Musculoskeletal: Positive for arthralgias, joint pain, joint swelling, myalgias, morning stiffness and myalgias. Negative for muscle weakness and muscle tenderness.  Skin: Positive for rash. Negative for color change, hair loss, redness, skin tightness, ulcers and sensitivity to sunlight.       Rash after Covid vaccine.  Treated with prednisone.  Allergic/Immunologic: Negative for susceptible to infections.  Neurological: Positive for headaches and weakness. Negative for dizziness, numbness, memory loss and night sweats.  Hematological: Positive for bruising/bleeding tendency. Negative for swollen glands.  Psychiatric/Behavioral: Positive for depressed mood. Negative for confusion and sleep disturbance. The patient is not nervous/anxious.  PMFS History:  Patient Active Problem List   Diagnosis Date Noted  . IBS (irritable bowel syndrome) 10/26/2015  . Anxiety 11/14/2011  . GERD (gastroesophageal reflux disease) 11/14/2011    Past Medical History:  Diagnosis Date  . Allergy   . Anxiety   . Asthma   . Depression   . GERD (gastroesophageal reflux  disease)   . IBS (irritable bowel syndrome)   . Kidney stones   . Meningitis   . Mitral valve prolapse   . Osteopenia   . Pneumonia     Family History  Problem Relation Age of Onset  . Heart disease Mother 75       cardiac stenting/CAD  . Hypertension Mother   . Lung cancer Mother   . Diabetes Mother   . Cancer Mother   . Stroke Father   . Alcohol abuse Father   . Arthritis Brother   . Anxiety disorder Daughter    Past Surgical History:  Procedure Laterality Date  . APPENDECTOMY    . CARPAL TUNNEL RELEASE     right and left wrist, right x 3  . CHOLECYSTECTOMY    . COLONOSCOPY  09/05/1998   normal; repeat in ten years.  [facility] COSMETIC SURGERY     eyelids  . KNEE ARTHROSCOPY Left   . orthoscopic shoulder     right shoulder  . SPINE SURGERY     cervical spine C5-6  . TONSILLECTOMY    . TUBAL LIGATION     Social History   Social History Narrative   Marital status:  Married x 14 years; happily married.  No abuse.  Moved from [place].      Children:  2 daughters; 1 grandson.      Lives:  With husband.      Employment:  Sales promotion account executive.        Tobacco: none      Alcohol: 4 glasses of wine weekly.      Drugs:none      Exercise: yes; walking and running   Immunization History  Administered Date(s) Administered  . Influenza,inj,Quad PF,6+ Mos 06/04/2015, 07/08/2016  . Influenza-Unspecified 06/24/2019  . PFIZER SARS-COV-2 Vaccination 10/27/2019, 11/20/2019  . Pneumococcal Conjugate-13 11/15/2017  . Pneumococcal Polysaccharide-23 01/31/2019  . Tdap 12/11/2013     Objective: Vital Signs: BP (!) 142/83 (BP Location: Right Arm, Patient Position: Sitting, Cuff Size: Normal)   Pulse 68   Resp 14   Ht 5' 2.5" (1.588 m)   Wt 135 lb (61.2 kg)   BMI 24.30 kg/m    Physical Exam Vitals and nursing note reviewed.  Constitutional:      Appearance: She is well-developed.  HENT:     Head: Normocephalic and atraumatic.  Eyes:     Conjunctiva/sclera:  Conjunctivae normal.  Cardiovascular:     Rate and Rhythm: Normal rate and regular rhythm.     Heart sounds: Normal heart sounds.  Pulmonary:     Effort: Pulmonary effort is normal.     Breath sounds: Normal breath sounds.  Abdominal:     General: Bowel sounds are normal.     Palpations: Abdomen is soft.  Musculoskeletal:     Cervical back: Normal range of motion.  Lymphadenopathy:     Cervical: No cervical adenopathy.  Skin:    General: Skin is warm and dry.     Capillary Refill: Capillary refill takes less than 2 seconds.  Neurological:     Mental Status: She is alert and oriented to person, place,  and time.  Psychiatric:        Behavior: Behavior normal.      Musculoskeletal Exam: Patient had good range of motion of her cervical spine with some discomfort.  Shoulder joints, elbow joints, wrist joints with good range of motion.  She has bilateral CMC, PIP and DIP thickening.  No synovitis was noted over MCP joints.  She has right middle trigger finger.  Hip joints with good range of motion.  She has tenderness on palpation over right trochanteric bursa.  Knee joints and ankle joints with good range of motion.  She has bilateral pes cavus with dorsal spurring and some DIP and PIP changes in her feet consistent with osteoarthritis.  CDAI Exam: CDAI Score: -- Patient Global: --; Provider Global: -- Swollen: --; Tender: -- Joint Exam 12/04/2019   No joint exam has been documented for this visit   There is currently no information documented on the homunculus. Go to the Rheumatology activity and complete the homunculus joint exam.  Investigation: No additional findings.  Imaging: XR HIP UNILAT W OR W/O PELVIS 2-3 VIEWS RIGHT  Result Date: 12/04/2019 No hip joint narrowing was noted.  No chondrocalcinosis was noted.  No SI joint narrowing was noted.  Inferior spur at the femoral head was noted.  Degenerative changes in the lumbar spine were noted. Impression: Unremarkable x-ray  of the hip joint.  XR Cervical Spine 2 or 3 views  Result Date: 12/04/2019 Multilevel spondylosis and facet joint arthropathy was noted.  C5-C6 surgical fusion was noted.  Significant narrowing between C4-5 was noted.  Mild spondylolisthesis was noted between C7 and T1. Impression: These findings are consistent with multilevel spondylosis and facet joint arthropathy.  XR Hand 2 View Left  Result Date: 12/04/2019 CMC narrowing was noted.  No MCP, intercarpal radiocarpal joint space narrowing was noted.  PIP and DIP narrowing was noted.  No erosive changes were noted. Impression: These findings are consistent with osteoarthritis of the hand.  XR Hand 2 View Right  Result Date: 12/04/2019 Severe CMC narrowing was noted.  PIP and DIP narrowing was noted.  No MCP, intercarpal radiocarpal joint space narrowing was noted.  No erosive changes were noted. Impression: These findings are consistent with osteoarthritis of the hand.   Recent Labs: Lab Results  Component Value Date   WBC 3.3 (L) 11/15/2017   HGB 14.3 11/15/2017   PLT 220 11/15/2017   NA 144 11/15/2017   K 4.1 11/15/2017   CL 103 11/15/2017   CO2 23 11/15/2017   GLUCOSE 83 11/15/2017   BUN 20 11/15/2017   CREATININE 0.77 11/15/2017   BILITOT 0.7 11/15/2017   ALKPHOS 70 11/15/2017   AST 27 11/15/2017   ALT 22 11/15/2017   PROT 6.6 11/15/2017   ALBUMIN 4.7 11/15/2017   CALCIUM 9.7 11/15/2017   GFRAA 92 11/15/2017    Speciality Comments: No specialty comments available.  Procedures:  No procedures performed Allergies: Erythromycin, Demerol, Penicillins, and Sulfa antibiotics   Assessment / Plan:     Visit Diagnoses: Polyarthralgia - 11/15/17: RF<10, CCP 3, TSH 2.06.  Patient complains of pain in multiple joints.  No synovitis was noted on examination.  Primary osteoarthritis of both hands -she has bilateral CMC PIP and DIP thickening consistent with osteoarthritis.  She has had right CMC injection x2.  She is also had  right hand carpal tunnel release x3.  She still have some symptoms of carpal tunnel syndrome.  She is very active with gardening painting and also  working as a Research officer, trade union.  Plan: XR Hand 2 View Right, XR Hand 2 View Left.  X-ray findings were consistent with osteoarthritis mostly involving her CMC PIP and DIP joints.  A handout on hand exercises was given.  Joint protection muscle strengthening was discussed.  A list of natural anti-inflammatories was given.  Neck pain -she has been experiencing some neck discomfort and sometimes pain radiating to her right shoulder.  Plan: XR Cervical Spine 2 or 3 views.  The x-ray showed multilevel spondylosis postsurgical changes and facet joint arthropathy.  A handout on C-spine exercises was given.  DDD (degenerative disc disease), cervical - C5-C6 fusion 1996  Trochanteric bursitis, right hip-she had tenderness on palpation over her right trochanteric bursa.  I have given her a handout on IT band exercises.  Pain in right hip -she had some discomfort range of motion of her right hip joint.  Although range of motion is complete.  Plan: XR HIP UNILAT W OR W/O PELVIS 2-3 VIEWS RIGHT.  X-ray of the hip joint was unremarkable.  Primary osteoarthritis of both feet-she has bilateral pes cavus, dorsal spurring and some DIP and PIP thickening.  She also gives history of fracture of her right ankle in the past.  She complains of some discomfort in the ankle during the wintertime.  History of osteopenia-she takes calcium and vitamin D.  History of gastroesophageal reflux (GERD)-she is on pantoprazole.  History of IBS-history of diarrhea alternating with constipation.  History of kidney stones-patient reports having only one episode.  Anxiety and depression-her symptoms are manageable on Lexapro and Xanax.  History of asthma-she uses Xyzal.  Orders: Orders Placed This Encounter  Procedures  . XR Cervical Spine 2 or 3 views  . XR Hand 2 View Right  .  XR Hand 2 View Left  . XR HIP UNILAT W OR W/O PELVIS 2-3 VIEWS RIGHT   No orders of the defined types were placed in this encounter.   Face-to-face time spent with patient was 45 minutes. Greater than 50% of time was spent in counseling and coordination of care.  Follow-Up Instructions: Return if symptoms worsen or fail to improve, for Osteoarthritis.   Bo Merino, MD  Note - This record has been created using Editor, commissioning.  Chart creation errors have been sought, but may not always  have been located. Such creation errors do not reflect on  the standard of medical care.

## 2019-12-04 ENCOUNTER — Ambulatory Visit: Payer: Self-pay

## 2019-12-04 ENCOUNTER — Other Ambulatory Visit: Payer: Self-pay

## 2019-12-04 ENCOUNTER — Ambulatory Visit: Payer: Medicare HMO | Admitting: Rheumatology

## 2019-12-04 ENCOUNTER — Encounter: Payer: Self-pay | Admitting: Rheumatology

## 2019-12-04 VITALS — BP 142/83 | HR 68 | Resp 14 | Ht 62.5 in | Wt 135.0 lb

## 2019-12-04 DIAGNOSIS — M19071 Primary osteoarthritis, right ankle and foot: Secondary | ICD-10-CM | POA: Diagnosis not present

## 2019-12-04 DIAGNOSIS — M25551 Pain in right hip: Secondary | ICD-10-CM | POA: Diagnosis not present

## 2019-12-04 DIAGNOSIS — Z8719 Personal history of other diseases of the digestive system: Secondary | ICD-10-CM | POA: Diagnosis not present

## 2019-12-04 DIAGNOSIS — M19042 Primary osteoarthritis, left hand: Secondary | ICD-10-CM | POA: Diagnosis not present

## 2019-12-04 DIAGNOSIS — Z8739 Personal history of other diseases of the musculoskeletal system and connective tissue: Secondary | ICD-10-CM

## 2019-12-04 DIAGNOSIS — Z87442 Personal history of urinary calculi: Secondary | ICD-10-CM | POA: Diagnosis not present

## 2019-12-04 DIAGNOSIS — F32A Depression, unspecified: Secondary | ICD-10-CM

## 2019-12-04 DIAGNOSIS — M19072 Primary osteoarthritis, left ankle and foot: Secondary | ICD-10-CM

## 2019-12-04 DIAGNOSIS — M2550 Pain in unspecified joint: Secondary | ICD-10-CM | POA: Diagnosis not present

## 2019-12-04 DIAGNOSIS — F419 Anxiety disorder, unspecified: Secondary | ICD-10-CM

## 2019-12-04 DIAGNOSIS — Z8709 Personal history of other diseases of the respiratory system: Secondary | ICD-10-CM

## 2019-12-04 DIAGNOSIS — M19041 Primary osteoarthritis, right hand: Secondary | ICD-10-CM

## 2019-12-04 DIAGNOSIS — F329 Major depressive disorder, single episode, unspecified: Secondary | ICD-10-CM

## 2019-12-04 DIAGNOSIS — M542 Cervicalgia: Secondary | ICD-10-CM

## 2019-12-04 DIAGNOSIS — M5030 Other cervical disc degeneration, unspecified cervical region: Secondary | ICD-10-CM

## 2019-12-04 DIAGNOSIS — M7061 Trochanteric bursitis, right hip: Secondary | ICD-10-CM

## 2019-12-04 NOTE — Patient Instructions (Signed)
Cervical Strain and Sprain Rehab Ask your health care provider which exercises are safe for you. Do exercises exactly as told by your health care provider and adjust them as directed. It is normal to feel mild stretching, pulling, tightness, or discomfort as you do these exercises. Stop right away if you feel sudden pain or your pain gets worse. Do not begin these exercises until told by your health care provider. Stretching and range-of-motion exercises Cervical side bending  1. Using good posture, sit on a stable chair or stand up. 2. Without moving your shoulders, slowly tilt your left / right ear to your shoulder until you feel a stretch in the opposite side neck muscles. You should be looking straight ahead. 3. Hold for __________ seconds. 4. Repeat with the other side of your neck. Repeat __________ times. Complete this exercise __________ times a day. Cervical rotation  1. Using good posture, sit on a stable chair or stand up. 2. Slowly turn your head to the side as if you are looking over your left / right shoulder. ? Keep your eyes level with the ground. ? Stop when you feel a stretch along the side and the back of your neck. 3. Hold for __________ seconds. 4. Repeat this by turning to your other side. Repeat __________ times. Complete this exercise __________ times a day. Thoracic extension and pectoral stretch 1. Roll a towel or a small blanket so it is about 4 inches (10 cm) in diameter. 2. Lie down on your back on a firm surface. 3. Put the towel lengthwise, under your spine in the middle of your back. It should not be under your shoulder blades. The towel should line up with your spine from your middle back to your lower back. 4. Put your hands behind your head and let your elbows fall out to your sides. 5. Hold for __________ seconds. Repeat __________ times. Complete this exercise __________ times a day. Strengthening exercises Isometric upper cervical flexion 1. Lie on  your back with a thin pillow behind your head and a small rolled-up towel under your neck. 2. Gently tuck your chin toward your chest and nod your head down to look toward your feet. Do not lift your head off the pillow. 3. Hold for __________ seconds. 4. Release the tension slowly. Relax your neck muscles completely before you repeat this exercise. Repeat __________ times. Complete this exercise __________ times a day. Isometric cervical extension  1. Stand about 6 inches (15 cm) away from a wall, with your back facing the wall. 2. Place a soft object, about 6-8 inches (15-20 cm) in diameter, between the back of your head and the wall. A soft object could be a small pillow, a ball, or a folded towel. 3. Gently tilt your head back and press into the soft object. Keep your jaw and forehead relaxed. 4. Hold for __________ seconds. 5. Release the tension slowly. Relax your neck muscles completely before you repeat this exercise. Repeat __________ times. Complete this exercise __________ times a day. Posture and body mechanics Body mechanics refers to the movements and positions of your body while you do your daily activities. Posture is part of body mechanics. Good posture and healthy body mechanics can help to relieve stress in your body's tissues and joints. Good posture means that your spine is in its natural S-curve position (your spine is neutral), your shoulders are pulled back slightly, and your head is not tipped forward. The following are general guidelines for applying improved   posture and body mechanics to your everyday activities. Sitting  1. When sitting, keep your spine neutral and keep your feet flat on the floor. Use a footrest, if necessary, and keep your thighs parallel to the floor. Avoid rounding your shoulders, and avoid tilting your head forward. 2. When working at a desk or a computer, keep your desk at a height where your hands are slightly lower than your elbows. Slide your  chair under your desk so you are close enough to maintain good posture. 3. When working at a computer, place your monitor at a height where you are looking straight ahead and you do not have to tilt your head forward or downward to look at the screen. Standing   When standing, keep your spine neutral and keep your feet about hip-width apart. Keep a slight bend in your knees. Your ears, shoulders, and hips should line up.  When you do a task in which you stand in one place for a long time, place one foot up on a stable object that is 2-4 inches (5-10 cm) high, such as a footstool. This helps keep your spine neutral. Resting When lying down and resting, avoid positions that are most painful for you. Try to support your neck in a neutral position. You can use a contour pillow or a small rolled-up towel. Your pillow should support your neck but not push on it. This information is not intended to replace advice given to you by your health care provider. Make sure you discuss any questions you have with your health care provider. Document Revised: 12/12/2018 Document Reviewed: 05/23/2018 Elsevier Patient Education  [facility] Band Syndrome Rehab Ask your health care provider which exercises are safe for you. Do exercises exactly as told by your health care provider and adjust them as directed. It is normal to feel mild stretching, pulling, tightness, or discomfort as you do these exercises. Stop right away if you feel sudden pain or your pain gets significantly worse. Do not begin these exercises until told by your health care provider. Stretching and range-of-motion exercises These exercises warm up your muscles and joints and improve the movement and flexibility of your hip and pelvis. Quadriceps stretch, prone  5. Lie on your abdomen on a firm surface, such as a bed or padded floor (prone position). 6. Bend your left / right knee and reach back to hold your ankle or pant leg. If  you cannot reach your ankle or pant leg, loop a belt around your foot and grab the belt instead. 7. Gently pull your heel toward your buttocks. Your knee should not slide out to the side. You should feel a stretch in the front of your thigh and knee (quadriceps). 8. Hold this position for __________ seconds. Repeat __________ times. Complete this exercise __________ times a day. Iliotibial band stretch An iliotibial band is a strong band of muscle tissue that runs from the outer side of your hip to the outer side of your thigh and knee. 5. Lie on your side with your left / right leg in the top position. 6. Bend both of your knees and grab your left / right ankle. Stretch out your bottom arm to help you balance. 7. Slowly bring your top knee back so your thigh goes behind your trunk. 8. Slowly lower your top leg toward the floor until you feel a gentle stretch on the outside of your left / right hip and thigh. If you do not feel a  stretch and your knee will not fall farther, place the heel of your other foot on top of your knee and pull your knee down toward the floor with your foot. 9. Hold this position for __________ seconds. Repeat __________ times. Complete this exercise __________ times a day. Strengthening exercises These exercises build strength and endurance in your hip and pelvis. Endurance is the ability to use your muscles for a long time, even after they get tired. Straight leg raises, side-lying This exercise strengthens the muscles that rotate the leg at the hip and move it away from your body (hip abductors). 6. Lie on your side with your left / right leg in the top position. Lie so your head, shoulder, hip, and knee line up. You may bend your bottom knee to help you balance. 7. Roll your hips slightly forward so your hips are stacked directly over each other and your left / right knee is facing forward. 8. Tense the muscles in your outer thigh and lift your top leg 4-6 inches (10-15  cm). 9. Hold this position for __________ seconds. 10. Slowly return to the starting position. Let your muscles relax completely before doing another repetition. Repeat __________ times. Complete this exercise __________ times a day. Leg raises, prone This exercise strengthens the muscles that move the hips (hip extensors). 5. Lie on your abdomen on your bed or a firm surface. You can put a pillow under your hips if that is more comfortable for your lower back. 6. Bend your left / right knee so your foot is straight up in the air. 7. Squeeze your buttocks muscles and lift your left / right thigh off the bed. Do not let your back arch. 8. Tense your thigh muscle as hard as you can without increasing any knee pain. 9. Hold this position for __________ seconds. 10. Slowly lower your leg to the starting position and allow it to relax completely. Repeat __________ times. Complete this exercise __________ times a day. Hip hike 6. Stand sideways on a bottom step. Stand on your left / right leg with your other foot unsupported next to the step. You can hold on to the railing or wall for balance if needed. 7. Keep your knees straight and your torso square. Then lift your left / right hip up toward the ceiling. 8. Slowly let your left / right hip lower toward the floor, past the starting position. Your foot should get closer to the floor. Do not lean or bend your knees. Repeat __________ times. Complete this exercise __________ times a day. This information is not intended to replace advice given to you by your health care provider. Make sure you discuss any questions you have with your health care provider. Document Revised: 12/13/2018 Document Reviewed: 06/13/2018 Elsevier Patient Education  [place]. Hand Exercises Hand exercises can be helpful for almost anyone. These exercises can strengthen the hands, improve flexibility and movement, and increase blood flow to the hands. These results  can make work and daily tasks easier. Hand exercises can be especially helpful for people who have joint pain from arthritis or have nerve damage from overuse (carpal tunnel syndrome). These exercises can also help people who have injured a hand. Exercises Most of these hand exercises are gentle stretching and motion exercises. It is usually safe to do them often throughout the day. Warming up your hands before exercise may help to reduce stiffness. You can do this with gentle massage or by placing your hands in warm  water for 10-15 minutes. It is normal to feel some stretching, pulling, tightness, or mild discomfort as you begin new exercises. This will gradually improve. Stop an exercise right away if you feel sudden, severe pain or your pain gets worse. Ask your health care provider which exercises are best for you. Knuckle bend or "claw" fist 1. Stand or sit with your arm, hand, and all five fingers pointed straight up. Make sure to keep your wrist straight during the exercise. 2. Gently bend your fingers down toward your palm until the tips of your fingers are touching the top of your palm. Keep your big knuckle straight and just bend the small knuckles in your fingers. 3. Hold this position for __________ seconds. 4. Straighten (extend) your fingers back to the starting position. Repeat this exercise 5-10 times with each hand. Full finger fist 1. Stand or sit with your arm, hand, and all five fingers pointed straight up. Make sure to keep your wrist straight during the exercise. 2. Gently bend your fingers into your palm until the tips of your fingers are touching the middle of your palm. 3. Hold this position for __________ seconds. 4. Extend your fingers back to the starting position, stretching every joint fully. Repeat this exercise 5-10 times with each hand. Straight fist 1. Stand or sit with your arm, hand, and all five fingers pointed straight up. Make sure to keep your wrist straight  during the exercise. 2. Gently bend your fingers at the big knuckle, where your fingers meet your hand, and the middle knuckle. Keep the knuckle at the tips of your fingers straight and try to touch the bottom of your palm. 3. Hold this position for __________ seconds. 4. Extend your fingers back to the starting position, stretching every joint fully. Repeat this exercise 5-10 times with each hand. Tabletop 1. Stand or sit with your arm, hand, and all five fingers pointed straight up. Make sure to keep your wrist straight during the exercise. 2. Gently bend your fingers at the big knuckle, where your fingers meet your hand, as far down as you can while keeping the small knuckles in your fingers straight. Think of forming a tabletop with your fingers. 3. Hold this position for __________ seconds. 4. Extend your fingers back to the starting position, stretching every joint fully. Repeat this exercise 5-10 times with each hand. Finger spread 1. Place your hand flat on a table with your palm facing down. Make sure your wrist stays straight as you do this exercise. 2. Spread your fingers and thumb apart from each other as far as you can until you feel a gentle stretch. Hold this position for __________ seconds. 3. Bring your fingers and thumb tight together again. Hold this position for __________ seconds. Repeat this exercise 5-10 times with each hand. Making circles 1. Stand or sit with your arm, hand, and all five fingers pointed straight up. Make sure to keep your wrist straight during the exercise. 2. Make a circle by touching the tip of your thumb to the tip of your index finger. 3. Hold for __________ seconds. Then open your hand wide. 4. Repeat this motion with your thumb and each finger on your hand. Repeat this exercise 5-10 times with each hand. Thumb motion 1. Sit with your forearm resting on a table and your wrist straight. Your thumb should be facing up toward the ceiling. Keep your  fingers relaxed as you move your thumb. 2. Lift your thumb up as high as  you can toward the ceiling. Hold for __________ seconds. 3. Bend your thumb across your palm as far as you can, reaching the tip of your thumb for the small finger (pinkie) side of your palm. Hold for __________ seconds. Repeat this exercise 5-10 times with each hand. Grip strengthening  1. Hold a stress ball or other soft ball in the middle of your hand. 2. Slowly increase the pressure, squeezing the ball as much as you can without causing pain. Think of bringing the tips of your fingers into the middle of your palm. All of your finger joints should bend when doing this exercise. 3. Hold your squeeze for __________ seconds, then relax. Repeat this exercise 5-10 times with each hand. Contact a health care provider if:  Your hand pain or discomfort gets much worse when you do an exercise.  Your hand pain or discomfort does not improve within 2 hours after you exercise. If you have any of these problems, stop doing these exercises right away. Do not do them again unless your health care provider says that you can. Get help right away if:  You develop sudden, severe hand pain or swelling. If this happens, stop doing these exercises right away. Do not do them again unless your health care provider says that you can. This information is not intended to replace advice given to you by your health care provider. Make sure you discuss any questions you have with your health care provider. Document Revised: 12/13/2018 Document Reviewed: 08/23/2018 Elsevier Patient Education  [place].

## 2019-12-16 ENCOUNTER — Telehealth: Payer: Self-pay | Admitting: Emergency Medicine

## 2019-12-16 ENCOUNTER — Other Ambulatory Visit: Payer: Self-pay

## 2019-12-16 DIAGNOSIS — K2100 Gastro-esophageal reflux disease with esophagitis, without bleeding: Secondary | ICD-10-CM

## 2019-12-16 MED ORDER — PANTOPRAZOLE SODIUM 40 MG PO TBEC: 40.0000 mg | DELAYED_RELEASE_TABLET | Freq: Every day | ORAL | 3 refills | Status: DC

## 2019-12-16 NOTE — Telephone Encounter (Signed)
What is the name of the medication? pantoprazole (Tina Bray) 40 MG tablet [id number]    Have you contacted your pharmacy to request a refill? Yes she needs an ov. She would like a curtesy refill, she has an upcoming appointment on 12/31/19 with Sagardia. She has 3 pills left.   Which pharmacy would you like this sent to? Pharmacy  [facility] [address] [place]  [address] [place] [address]  Phone:  [phone] Fax:  [phone]       Patient notified that their request is being sent to the clinical staff for review and that they should receive a call once it is complete. If they do not receive a call within 72 hours they can check with their pharmacy or our office.

## 2019-12-31 ENCOUNTER — Ambulatory Visit: Payer: Self-pay | Admitting: Emergency Medicine

## 2020-01-01 ENCOUNTER — Other Ambulatory Visit: Payer: Self-pay

## 2020-01-01 ENCOUNTER — Encounter: Payer: Self-pay | Admitting: Emergency Medicine

## 2020-01-01 ENCOUNTER — Ambulatory Visit (INDEPENDENT_AMBULATORY_CARE_PROVIDER_SITE_OTHER): Payer: Medicare HMO | Admitting: Emergency Medicine

## 2020-01-01 VITALS — BP 151/84 | HR 68 | Temp 98.1°F | Ht 62.0 in | Wt 132.4 lb

## 2020-01-01 DIAGNOSIS — Z1329 Encounter for screening for other suspected endocrine disorder: Secondary | ICD-10-CM | POA: Diagnosis not present

## 2020-01-01 DIAGNOSIS — Z130 Encounter for screening for diseases of the blood and blood-forming organs and certain disorders involving the immune mechanism: Secondary | ICD-10-CM | POA: Diagnosis not present

## 2020-01-01 DIAGNOSIS — F410 Panic disorder [episodic paroxysmal anxiety] without agoraphobia: Secondary | ICD-10-CM | POA: Diagnosis not present

## 2020-01-01 DIAGNOSIS — N898 Other specified noninflammatory disorders of vagina: Secondary | ICD-10-CM

## 2020-01-01 DIAGNOSIS — F341 Dysthymic disorder: Secondary | ICD-10-CM

## 2020-01-01 DIAGNOSIS — K2100 Gastro-esophageal reflux disease with esophagitis, without bleeding: Secondary | ICD-10-CM | POA: Diagnosis not present

## 2020-01-01 DIAGNOSIS — Z13228 Encounter for screening for other metabolic disorders: Secondary | ICD-10-CM

## 2020-01-01 DIAGNOSIS — Z13220 Encounter for screening for lipoid disorders: Secondary | ICD-10-CM

## 2020-01-01 DIAGNOSIS — Z1321 Encounter for screening for nutritional disorder: Secondary | ICD-10-CM | POA: Diagnosis not present

## 2020-01-01 DIAGNOSIS — R69 Illness, unspecified: Secondary | ICD-10-CM | POA: Diagnosis not present

## 2020-01-01 DIAGNOSIS — M2550 Pain in unspecified joint: Secondary | ICD-10-CM | POA: Diagnosis not present

## 2020-01-01 MED ORDER — ALPRAZOLAM 0.5 MG PO TABS: 0.5000 mg | ORAL_TABLET | Freq: Every day | ORAL | 1 refills | Status: DC | PRN

## 2020-01-01 MED ORDER — ESTRADIOL 0.1 MG/GM VA CREA: TOPICAL_CREAM | VAGINAL | 11 refills | Status: DC

## 2020-01-01 MED ORDER — ESCITALOPRAM OXALATE 20 MG PO TABS: 20.0000 mg | ORAL_TABLET | Freq: Every day | ORAL | 3 refills | Status: DC

## 2020-01-01 NOTE — Patient Instructions (Addendum)
   If you have lab work done today you will be contacted with your lab results within the next 2 weeks.  If you have not heard from us then please contact us. The fastest way to get your results is to register for My Chart.   IF you received an x-ray today, you will receive an invoice from [facility]. Please contact [facility] at [phone] with questions or concerns regarding your invoice.   IF you received labwork today, you will receive an invoice from [facility]. Please contact [facility] at [phone] with questions or concerns regarding your invoice.   Our billing staff will not be able to assist you with questions regarding bills from these companies.  You will be contacted with the lab results as soon as they are available. The fastest way to get your results is to activate your My Chart account. Instructions are located on the last page of this paperwork. If you have not heard from us regarding the results in 2 weeks, please contact this office.     Health Maintenance After Age 65 After age 65, you are at a higher risk for certain long-term diseases and infections as well as injuries from falls. Falls are a major cause of broken bones and head injuries in people who are older than age 65. Getting regular preventive care can help to keep you healthy and well. Preventive care includes getting regular testing and making lifestyle changes as recommended by your health care provider. Talk with your health care provider about:  Which screenings and tests you should have. A screening is a test that checks for a disease when you have no symptoms.  A diet and exercise plan that is right for you. What should I know about screenings and tests to prevent falls? Screening and testing are the best ways to find a health problem early. Early diagnosis and treatment give you the best chance of managing medical conditions that are common after age 65. Certain conditions and  lifestyle choices may make you more likely to have a fall. Your health care provider may recommend:  Regular vision checks. Poor vision and conditions such as cataracts can make you more likely to have a fall. If you wear glasses, make sure to get your prescription updated if your vision changes.  Medicine review. Work with your health care provider to regularly review all of the medicines you are taking, including over-the-counter medicines. Ask your health care provider about any side effects that may make you more likely to have a fall. Tell your health care provider if any medicines that you take make you feel dizzy or sleepy.  Osteoporosis screening. Osteoporosis is a condition that causes the bones to get weaker. This can make the bones weak and cause them to break more easily.  Blood pressure screening. Blood pressure changes and medicines to control blood pressure can make you feel dizzy.  Strength and balance checks. Your health care provider may recommend certain tests to check your strength and balance while standing, walking, or changing positions.  Foot health exam. Foot pain and numbness, as well as not wearing proper footwear, can make you more likely to have a fall.  Depression screening. You may be more likely to have a fall if you have a fear of falling, feel emotionally low, or feel unable to do activities that you used to do.  Alcohol use screening. Using too much alcohol can affect your balance and may make you more likely to   have a fall. What actions can I take to lower my risk of falls? General instructions  Talk with your health care provider about your risks for falling. Tell your health care provider if: ? You fall. Be sure to tell your health care provider about all falls, even ones that seem minor. ? You feel dizzy, sleepy, or off-balance.  Take over-the-counter and prescription medicines only as told by your health care provider. These include any  supplements.  Eat a healthy diet and maintain a healthy weight. A healthy diet includes low-fat dairy products, low-fat (lean) meats, and fiber from whole grains, beans, and lots of fruits and vegetables. Home safety  Remove any tripping hazards, such as rugs, cords, and clutter.  Install safety equipment such as grab bars in bathrooms and safety rails on stairs.  Keep rooms and walkways well-lit. Activity   Follow a regular exercise program to stay fit. This will help you maintain your balance. Ask your health care provider what types of exercise are appropriate for you.  If you need a cane or walker, use it as recommended by your health care provider.  Wear supportive shoes that have nonskid soles. Lifestyle  Do not drink alcohol if your health care provider tells you not to drink.  If you drink alcohol, limit how much you have: ? 0-1 drink a day for women. ? 0-2 drinks a day for men.  Be aware of how much alcohol is in your drink. In the [place] one drink equals one typical bottle of beer (12 oz), one-half glass of wine (5 oz), or one shot of hard liquor (1 oz).  Do not use any products that contain nicotine or tobacco, such as cigarettes and e-cigarettes. If you need help quitting, ask your health care provider. Summary  Having a healthy lifestyle and getting preventive care can help to protect your health and wellness after age 65.  Screening and testing are the best way to find a health problem early and help you avoid having a fall. Early diagnosis and treatment give you the best chance for managing medical conditions that are more common for people who are older than age 65.  Falls are a major cause of broken bones and head injuries in people who are older than age 65. Take precautions to prevent a fall at home.  Work with your health care provider to learn what changes you can make to improve your health and wellness and to prevent falls. This information is not intended  to replace advice given to you by your health care provider. Make sure you discuss any questions you have with your health care provider. Document Revised: 12/13/2018 Document Reviewed: 07/05/2017 Elsevier Patient Education  2020 [employer]  

## 2020-01-01 NOTE — Progress Notes (Signed)
Thayer Headings L Scales Outlaw 69 y.o.   Chief Complaint  Patient presents with  . Medical Management of Chronic Issues    f/u with med refills (meds pended)    HISTORY OF PRESENT ILLNESS: This is a 69 y.o. female with chronic medical problems here for follow-up and medication refill. A1A. 1.  Generalized anxiety disorder with occasional panic attacks: On Lexapro 20 mg and Xanax 0.5 mg as needed. 2.  History of GERD: On Protonix 40 mg.  Working well. 3.  Multiple arthralgias.  Osteoarthritis.  Recently seen by rheumatologist. Doing well.  Has no complaints or medical concerns. Stays physically active.  Non-smoker.  Good nutrition. Wt Readings from Last 3 Encounters:  01/01/20 132 lb 6.4 oz (60.1 kg)  12/04/19 135 lb (61.2 kg)  08/20/19 131 lb 12.8 oz (59.8 kg)  Fully vaccinated against Covid.   HPI   Prior to Admission medications   Medication Sig Start Date End Date Taking? Authorizing Provider  ALPRAZolam Duanne Moron) 0.5 MG tablet Take 1 tablet (0.5 mg total) by mouth daily as needed for anxiety. Use sparingly and take for panic symptoms only. 02/12/19  Yes SagardiaInes Bloomer, MD  b complex vitamins tablet Take 1 tablet by mouth daily.   Yes [provider]  BIOTIN 5000 PO Take by mouth daily.   Yes [provider]  cholecalciferol (VITAMIN D) 1000 UNITS tablet Take 1,000 Units by mouth daily.   Yes [provider]  escitalopram (LEXAPRO) 20 MG tablet Take 1 tablet (20 mg total) by mouth daily. 01/31/19 01/01/20 Yes Gianna Calef, Ines Bloomer, MD  estradiol (ESTRACE) 0.1 MG/GM vaginal cream Place 0.2 applicator vaginally twice a week 11/12/14  Yes Copland, Gay Filler, MD  levocetirizine (XYZAL) 2.5 MG/5ML solution Take 2.5 mg by mouth every evening.   Yes [provider]  Multiple Vitamin (MULTIVITAMIN) tablet Take 1 tablet by mouth daily.   Yes [provider]  pantoprazole (PROTONIX) 40 MG tablet Take 1 tablet (40 mg total) by mouth daily. Take 30  minutes before breakfast daily. 12/16/19  Yes Carollee Nussbaumer, Ines Bloomer, MD  RESTASIS 0.05 % ophthalmic emulsion 1 drop 2 (two) times daily. 11/21/19  Yes [provider]    Allergies  Allergen Reactions  . Erythromycin Anaphylaxis  . Demerol Other (See Comments)    blisters  . Penicillins Hives  . Sulfa Antibiotics Hives    Patient Active Problem List   Diagnosis Date Noted  . IBS (irritable bowel syndrome) 10/26/2015  . Anxiety 11/14/2011  . GERD (gastroesophageal reflux disease) 11/14/2011    Past Medical History:  Diagnosis Date  . Allergy   . Anxiety   . Asthma   . Depression   . GERD (gastroesophageal reflux disease)   . IBS (irritable bowel syndrome)   . Kidney stones   . Meningitis   . Mitral valve prolapse   . Osteopenia   . Pneumonia     Past Surgical History:  Procedure Laterality Date  . APPENDECTOMY    . CARPAL TUNNEL RELEASE     right and left wrist, right x 3  . CHOLECYSTECTOMY    . COLONOSCOPY  09/05/1998   normal; repeat in ten years.  Marland Kitchen COSMETIC SURGERY     eyelids  . KNEE ARTHROSCOPY Left   . orthoscopic shoulder     right shoulder  . SPINE SURGERY     cervical spine C5-6  . TONSILLECTOMY    . TUBAL LIGATION      Social History  Socioeconomic History  . Marital status: Married    Spouse name: Not on file  . Number of children: 2  . Years of education: Not on file  . Highest education level: Not on file  Occupational History  . Occupation: retired  Tobacco Use  . Smoking status: Never Smoker  . Smokeless tobacco: Never Used  Substance and Sexual Activity  . Alcohol use: Yes    Comment: occ  . Drug use: No  . Sexual activity: Yes    Birth control/protection: None  Other Topics Concern  . Not on file  Social History Narrative   Marital status:  Married x 14 years; happily married.  No abuse.  Moved from [place].      Children:  2 daughters; 1 grandson.      Lives:  With husband.      Employment:  Surveyor, minerals.        Tobacco: none      Alcohol: 4 glasses of wine weekly.      Drugs:none      Exercise: yes; walking and running   Social Determinants of Health   Financial Resource Strain:   . Difficulty of Paying Living Expenses:   Food Insecurity:   . Worried About Charity fundraiser in the Last Year:   . Arboriculturist in the Last Year:   Transportation Needs:   . Film/video editor (Medical):   Marland Kitchen Lack of Transportation (Non-Medical):   Physical Activity:   . Days of Exercise per Week:   . Minutes of Exercise per Session:   Stress:   . Feeling of Stress :   Social Connections:   . Frequency of Communication with Friends and Family:   . Frequency of Social Gatherings with Friends and Family:   . Attends Religious Services:   . Active Member of Clubs or Organizations:   . Attends Archivist Meetings:   Marland Kitchen Marital Status:   Intimate Partner Violence:   . Fear of Current or Ex-Partner:   . Emotionally Abused:   Marland Kitchen Physically Abused:   . Sexually Abused:     Family History  Problem Relation Age of Onset  . Heart disease Mother 71       cardiac stenting/CAD  . Hypertension Mother   . Lung cancer Mother   . Diabetes Mother   . Cancer Mother   . Stroke Father   . Alcohol abuse Father   . Arthritis Brother   . Anxiety disorder Daughter      Review of Systems  Constitutional: Negative.  Negative for chills and fever.  HENT: Negative.  Negative for congestion and sore throat.   Respiratory: Negative.  Negative for cough and shortness of breath.   Cardiovascular: Negative.  Negative for chest pain and palpitations.  Gastrointestinal: Negative.  Negative for abdominal pain, blood in stool, diarrhea, melena, nausea and vomiting.  Genitourinary: Negative.  Negative for dysuria and hematuria.  Musculoskeletal: Positive for joint pain.  Skin: Negative.  Negative for rash.  Neurological: Negative.  Negative for dizziness and headaches.  All other systems  reviewed and are negative.  Today's Vitals   01/01/20 1426  BP: (!) 151/84  Pulse: 68  Temp: 98.1 F (36.7 C)  TempSrc: Temporal  SpO2: 95%  Weight: 132 lb 6.4 oz (60.1 kg)  Height: '5\' 2"'  (1.575 m)   Body mass index is 24.22 kg/m.   Physical Exam Vitals reviewed.  Constitutional:      Appearance:  Normal appearance.  HENT:     Head: Normocephalic.     Mouth/Throat:     Mouth: Mucous membranes are moist.     Pharynx: Oropharynx is clear.  Eyes:     Extraocular Movements: Extraocular movements intact.     Conjunctiva/sclera: Conjunctivae normal.     Pupils: Pupils are equal, round, and reactive to light.  Cardiovascular:     Rate and Rhythm: Normal rate and regular rhythm.     Pulses: Normal pulses.     Heart sounds: Normal heart sounds.  Pulmonary:     Effort: Pulmonary effort is normal.     Breath sounds: Normal breath sounds.  Musculoskeletal:        General: Normal range of motion.     Cervical back: Normal range of motion and neck supple. No tenderness.  Lymphadenopathy:     Cervical: No cervical adenopathy.  Skin:    General: Skin is warm and dry.     Capillary Refill: Capillary refill takes less than 2 seconds.  Neurological:     General: No focal deficit present.     Mental Status: She is alert and oriented to person, place, and time.  Psychiatric:        Mood and Affect: Mood normal.        Behavior: Behavior normal.    A total of 30 minutes was spent with the patient, greater than 50% of which was in counseling/coordination of care regarding chronic medical issues and management, review of all medications and refills, review of most recent office visit notes, review of most recent blood work results, diet, nutrition, and physical exercise, prognosis and need for follow-up.   ASSESSMENT & PLAN: Temari was seen today for medical management of chronic issues.  Diagnoses and all orders for this visit:  Dysthymia Comments: Controlled. Continue current  plan.  Orders: -     escitalopram (LEXAPRO) 20 MG tablet; Take 1 tablet (20 mg total) by mouth daily.  Vaginal dryness -     estradiol (ESTRACE) 0.1 MG/GM vaginal cream; Place 0.2 applicator vaginally twice a week  Arthralgia of multiple joints  Gastroesophageal reflux disease with esophagitis, unspecified whether hemorrhage -     Lipid panel -     CMP14+EGFR -     Hemoglobin A1c  Panic attack -     ALPRAZolam (XANAX) 0.5 MG tablet; Take 1 tablet (0.5 mg total) by mouth daily as needed for anxiety. Use sparingly and take for panic symptoms only.  Screening for endocrine, nutritional, metabolic and immunity disorder -     CMP14+EGFR -     Hemoglobin A1c  Screening for lipoid disorders -     Lipid panel    Patient Instructions       If you have lab work done today you will be contacted with your lab results within the next 2 weeks.  If you have not heard from [place] then please contact us. The fastest way to get your results is to register for My Chart.   IF you received an x-ray today, you will receive an invoice from [facility]. Please contact [facility] at [phone] with questions or concerns regarding your invoice.   IF you received labwork today, you will receive an invoice from [facility]. Please contact [facility] at [phone] with questions or concerns regarding your invoice.   Our billing staff will not be able to assist you with questions regarding bills from these companies.  You will be contacted with the lab results  as soon as they are available. The fastest way to get your results is to activate your My Chart account. Instructions are located on the last page of this paperwork. If you have not heard from [place] regarding the results in 2 weeks, please contact this office.      Health Maintenance After Age 45 After age 33, you are at a higher risk for certain long-term diseases and infections as well as injuries from falls. Falls are a major  cause of broken bones and head injuries in people who are older than age 62. Getting regular preventive care can help to keep you healthy and well. Preventive care includes getting regular testing and making lifestyle changes as recommended by your health care provider. Talk with your health care provider about:  Which screenings and tests you should have. A screening is a test that checks for a disease when you have no symptoms.  A diet and exercise plan that is right for you. What should I know about screenings and tests to prevent falls? Screening and testing are the best ways to find a health problem early. Early diagnosis and treatment give you the best chance of managing medical conditions that are common after age 52. Certain conditions and lifestyle choices may make you more likely to have a fall. Your health care provider may recommend:  Regular vision checks. Poor vision and conditions such as cataracts can make you more likely to have a fall. If you wear glasses, make sure to get your prescription updated if your vision changes.  Medicine review. Work with your health care provider to regularly review all of the medicines you are taking, including over-the-counter medicines. Ask your health care provider about any side effects that may make you more likely to have a fall. Tell your health care provider if any medicines that you take make you feel dizzy or sleepy.  Osteoporosis screening. Osteoporosis is a condition that causes the bones to get weaker. This can make the bones weak and cause them to break more easily.  Blood pressure screening. Blood pressure changes and medicines to control blood pressure can make you feel dizzy.  Strength and balance checks. Your health care provider may recommend certain tests to check your strength and balance while standing, walking, or changing positions.  Foot health exam. Foot pain and numbness, as well as not wearing proper footwear, can make you  more likely to have a fall.  Depression screening. You may be more likely to have a fall if you have a fear of falling, feel emotionally low, or feel unable to do activities that you used to do.  Alcohol use screening. Using too much alcohol can affect your balance and may make you more likely to have a fall. What actions can I take to lower my risk of falls? General instructions  Talk with your health care provider about your risks for falling. Tell your health care provider if: ? You fall. Be sure to tell your health care provider about all falls, even ones that seem minor. ? You feel dizzy, sleepy, or off-balance.  Take over-the-counter and prescription medicines only as told by your health care provider. These include any supplements.  Eat a healthy diet and maintain a healthy weight. A healthy diet includes low-fat dairy products, low-fat (lean) meats, and fiber from whole grains, beans, and lots of fruits and vegetables. Home safety  Remove any tripping hazards, such as rugs, cords, and clutter.  Install safety equipment such as  grab bars in bathrooms and safety rails on stairs.  Keep rooms and walkways well-lit. Activity   Follow a regular exercise program to stay fit. This will help you maintain your balance. Ask your health care provider what types of exercise are appropriate for you.  If you need a cane or walker, use it as recommended by your health care provider.  Wear supportive shoes that have nonskid soles. Lifestyle  Do not drink alcohol if your health care provider tells you not to drink.  If you drink alcohol, limit how much you have: ? 0-1 drink a day for women. ? 0-2 drinks a day for men.  Be aware of how much alcohol is in your drink. In the [place] one drink equals one typical bottle of beer (12 oz), one-half glass of wine (5 oz), or one shot of hard liquor (1 oz).  Do not use any products that contain nicotine or tobacco, such as cigarettes and  e-cigarettes. If you need help quitting, ask your health care provider. Summary  Having a healthy lifestyle and getting preventive care can help to protect your health and wellness after age 98.  Screening and testing are the best way to find a health problem early and help you avoid having a fall. Early diagnosis and treatment give you the best chance for managing medical conditions that are more common for people who are older than age 1.  Falls are a major cause of broken bones and head injuries in people who are older than age 7. Take precautions to prevent a fall at home.  Work with your health care provider to learn what changes you can make to improve your health and wellness and to prevent falls. This information is not intended to replace advice given to you by your health care provider. Make sure you discuss any questions you have with your health care provider. Document Revised: 12/13/2018 Document Reviewed: 07/05/2017 Elsevier Patient Education  2020 [employer]vier [employer].      Agustina Caroli, MD [facility]

## 2020-01-02 ENCOUNTER — Ambulatory Visit: Payer: Medicare HMO | Admitting: Rheumatology

## 2020-01-02 ENCOUNTER — Encounter: Payer: Self-pay | Admitting: Emergency Medicine

## 2020-01-02 LAB — CMP14+EGFR
ALT: 22 IU/L (ref 0–32)
AST: 25 IU/L (ref 0–40)
Albumin/Globulin Ratio: 2.1 (ref 1.2–2.2)
Albumin: 4.4 g/dL (ref 3.8–4.8)
Alkaline Phosphatase: 84 IU/L (ref 39–117)
BUN/Creatinine Ratio: 20 (ref 12–28)
BUN: 16 mg/dL (ref 8–27)
Bilirubin Total: 0.5 mg/dL (ref 0.0–1.2)
CO2: 24 mmol/L (ref 20–29)
Calcium: 9.9 mg/dL (ref 8.7–10.3)
Chloride: 104 mmol/L (ref 96–106)
Creatinine, Ser: 0.8 mg/dL (ref 0.57–1.00)
GFR calc Af Amer: 86 mL/min/{1.73_m2} (ref >59)
GFR calc non Af Amer: 75 mL/min/{1.73_m2} (ref >59)
Globulin, Total: 2.1 g/dL (ref 1.5–4.5)
Glucose: 82 mg/dL (ref 65–99)
Potassium: 4.2 mmol/L (ref 3.5–5.2)
Sodium: 141 mmol/L (ref 134–144)
Total Protein: 6.5 g/dL (ref 6.0–8.5)

## 2020-01-02 LAB — HEMOGLOBIN A1C
Est. average glucose Bld gHb Est-mCnc: 103 mg/dL
Hgb A1c MFr Bld: 5.2 % (ref 4.8–5.6)

## 2020-01-02 LAB — LIPID PANEL
Chol/HDL Ratio: 2.4 ratio (ref 0.0–4.4)
Cholesterol, Total: 177 mg/dL (ref 100–199)
HDL: 75 mg/dL (ref >39)
LDL Chol Calc (NIH): 93 mg/dL (ref 0–99)
Triglycerides: 44 mg/dL (ref 0–149)
VLDL Cholesterol Cal: 9 mg/dL (ref 5–40)

## 2020-01-03 ENCOUNTER — Other Ambulatory Visit: Payer: Self-pay

## 2020-01-03 DIAGNOSIS — N898 Other specified noninflammatory disorders of vagina: Secondary | ICD-10-CM

## 2020-01-03 MED ORDER — ESTRADIOL 0.1 MG/GM VA CREA: TOPICAL_CREAM | VAGINAL | 11 refills | Status: DC

## 2020-01-29 DIAGNOSIS — H25043 Posterior subcapsular polar age-related cataract, bilateral: Secondary | ICD-10-CM | POA: Diagnosis not present

## 2020-01-29 DIAGNOSIS — H2511 Age-related nuclear cataract, right eye: Secondary | ICD-10-CM | POA: Diagnosis not present

## 2020-01-29 DIAGNOSIS — H2513 Age-related nuclear cataract, bilateral: Secondary | ICD-10-CM | POA: Diagnosis not present

## 2020-01-29 DIAGNOSIS — H25013 Cortical age-related cataract, bilateral: Secondary | ICD-10-CM | POA: Diagnosis not present

## 2020-01-29 DIAGNOSIS — H18413 Arcus senilis, bilateral: Secondary | ICD-10-CM | POA: Diagnosis not present

## 2020-02-06 DIAGNOSIS — M25531 Pain in right wrist: Secondary | ICD-10-CM | POA: Diagnosis not present

## 2020-02-13 DIAGNOSIS — M25531 Pain in right wrist: Secondary | ICD-10-CM | POA: Diagnosis not present

## 2020-02-18 DIAGNOSIS — H25042 Posterior subcapsular polar age-related cataract, left eye: Secondary | ICD-10-CM | POA: Diagnosis not present

## 2020-02-18 DIAGNOSIS — H25012 Cortical age-related cataract, left eye: Secondary | ICD-10-CM | POA: Diagnosis not present

## 2020-02-18 DIAGNOSIS — H2512 Age-related nuclear cataract, left eye: Secondary | ICD-10-CM | POA: Diagnosis not present

## 2020-02-18 DIAGNOSIS — H2511 Age-related nuclear cataract, right eye: Secondary | ICD-10-CM | POA: Diagnosis not present

## 2020-02-25 DIAGNOSIS — H2512 Age-related nuclear cataract, left eye: Secondary | ICD-10-CM | POA: Diagnosis not present

## 2020-04-07 DIAGNOSIS — M25531 Pain in right wrist: Secondary | ICD-10-CM | POA: Diagnosis not present

## 2020-04-16 DIAGNOSIS — M25531 Pain in right wrist: Secondary | ICD-10-CM | POA: Diagnosis not present

## 2020-04-23 DIAGNOSIS — M25531 Pain in right wrist: Secondary | ICD-10-CM | POA: Diagnosis not present

## 2020-04-28 DIAGNOSIS — M25531 Pain in right wrist: Secondary | ICD-10-CM | POA: Diagnosis not present

## 2020-04-28 DIAGNOSIS — M79642 Pain in left hand: Secondary | ICD-10-CM | POA: Diagnosis not present

## 2020-04-28 DIAGNOSIS — M13841 Other specified arthritis, right hand: Secondary | ICD-10-CM | POA: Diagnosis not present

## 2020-04-28 DIAGNOSIS — G5601 Carpal tunnel syndrome, right upper limb: Secondary | ICD-10-CM | POA: Diagnosis not present

## 2020-05-26 DIAGNOSIS — G5601 Carpal tunnel syndrome, right upper limb: Secondary | ICD-10-CM | POA: Diagnosis not present

## 2020-05-26 DIAGNOSIS — M13841 Other specified arthritis, right hand: Secondary | ICD-10-CM | POA: Diagnosis not present

## 2020-06-10 DIAGNOSIS — R69 Illness, unspecified: Secondary | ICD-10-CM | POA: Diagnosis not present

## 2020-06-11 ENCOUNTER — Telehealth: Payer: Self-pay | Admitting: *Deleted

## 2020-06-11 NOTE — Telephone Encounter (Signed)
SCHEDULE AWV 

## 2020-06-15 ENCOUNTER — Telehealth: Payer: Self-pay | Admitting: Emergency Medicine

## 2020-06-15 NOTE — Telephone Encounter (Signed)
Pt is out of town and is wanting you to try calling next month

## 2020-07-01 ENCOUNTER — Other Ambulatory Visit: Payer: Self-pay

## 2020-07-01 ENCOUNTER — Encounter: Payer: Self-pay | Admitting: Emergency Medicine

## 2020-07-01 ENCOUNTER — Ambulatory Visit (INDEPENDENT_AMBULATORY_CARE_PROVIDER_SITE_OTHER): Payer: Medicare HMO | Admitting: Emergency Medicine

## 2020-07-01 VITALS — BP 146/84 | HR 70 | Temp 98.2°F | Resp 16 | Ht 62.0 in | Wt 136.0 lb

## 2020-07-01 DIAGNOSIS — K219 Gastro-esophageal reflux disease without esophagitis: Secondary | ICD-10-CM

## 2020-07-01 DIAGNOSIS — R42 Dizziness and giddiness: Secondary | ICD-10-CM

## 2020-07-01 DIAGNOSIS — F341 Dysthymic disorder: Secondary | ICD-10-CM

## 2020-07-01 DIAGNOSIS — R69 Illness, unspecified: Secondary | ICD-10-CM | POA: Diagnosis not present

## 2020-07-01 MED ORDER — FAMOTIDINE 40 MG PO TABS: 40.0000 mg | ORAL_TABLET | Freq: Every day | ORAL | 0 refills | Status: DC

## 2020-07-01 NOTE — Progress Notes (Signed)
Thayer Headings L Scales Outlaw 69 y.o.   Chief Complaint  Patient presents with  . Gastroesophageal Reflux    follow up   . Ear Pain    Right and restarted this morning    HISTORY OF PRESENT ILLNESS: This is a 69 y.o. female complaining of increased gastroesophageal reflux symptoms for the past couple of months since her father passed away last 2023/04/07.  Has intermittent dizziness triggered by stress.  Has been a very stressful period since father's death. Also has occasional right ear pain. No other complaints or medical concerns today.  HPI   Prior to Admission medications   Medication Sig Start Date End Date Taking? Authorizing Provider  ALPRAZolam Duanne Moron) 0.5 MG tablet Take 1 tablet (0.5 mg total) by mouth daily as needed for anxiety. Use sparingly and take for panic symptoms only. 01/01/20   Horald Pollen, MD  b complex vitamins tablet Take 1 tablet by mouth daily.    [provider]  BIOTIN 5000 PO Take by mouth daily.    [provider]  cholecalciferol (VITAMIN D) 1000 UNITS tablet Take 1,000 Units by mouth daily.    [provider]  escitalopram (LEXAPRO) 20 MG tablet Take 1 tablet (20 mg total) by mouth daily. 01/01/20 03/31/20  Horald Pollen, MD  estradiol (ESTRACE) 0.1 MG/GM vaginal cream Place 0.2 applicator vaginally twice a week 01/03/20   Horald Pollen, MD  levocetirizine Harlow Ohms) 2.5 MG/5ML solution Take 2.5 mg by mouth every evening.    [provider]  Multiple Vitamin (MULTIVITAMIN) tablet Take 1 tablet by mouth daily.    [provider]  pantoprazole (PROTONIX) 40 MG tablet Take 1 tablet (40 mg total) by mouth daily. Take 30 minutes before breakfast daily. 12/16/19   Horald Pollen, MD  RESTASIS 0.05 % ophthalmic emulsion 1 drop 2 (two) times daily. 11/21/19   [provider]    Allergies  Allergen Reactions  . Erythromycin Anaphylaxis  . Demerol Other (See Comments)    blisters  .  Penicillins Hives  . Sulfa Antibiotics Hives    Patient Active Problem List   Diagnosis Date Noted  . IBS (irritable bowel syndrome) 10/26/2015  . Anxiety 11/14/2011  . GERD (gastroesophageal reflux disease) 11/14/2011    Past Medical History:  Diagnosis Date  . Allergy   . Anxiety   . Asthma   . Depression   . GERD (gastroesophageal reflux disease)   . IBS (irritable bowel syndrome)   . Kidney stones   . Meningitis   . Mitral valve prolapse   . Osteopenia   . Pneumonia     Past Surgical History:  Procedure Laterality Date  . APPENDECTOMY    . CARPAL TUNNEL RELEASE     right and left wrist, right x 3  . CHOLECYSTECTOMY    . COLONOSCOPY  09/05/1998   normal; repeat in ten years.  Marland Kitchen COSMETIC SURGERY     eyelids  . KNEE ARTHROSCOPY Left   . orthoscopic shoulder     right shoulder  . SPINE SURGERY     cervical spine C5-6  . TONSILLECTOMY    . TUBAL LIGATION      Social History   Socioeconomic History  . Marital status: Married    Spouse name: Not on file  . Number of children: 2  . Years of education: Not on file  . Highest education level: Not on file  Occupational History  . Occupation: retired  Tobacco Use  .  Smoking status: Never Smoker  . Smokeless tobacco: Never Used  Vaping Use  . Vaping Use: Never used  Substance and Sexual Activity  . Alcohol use: Yes    Comment: occ  . Drug use: No  . Sexual activity: Yes    Birth control/protection: None  Other Topics Concern  . Not on file  Social History Narrative   Marital status:  Married x 14 years; happily married.  No abuse.  Moved from [place].      Children:  2 daughters; 1 grandson.      Lives:  With husband.      Employment:  Sales promotion account executive.        Tobacco: none      Alcohol: 4 glasses of wine weekly.      Drugs:none      Exercise: yes; walking and running   Social Determinants of Health   Financial Resource Strain:   . Difficulty of Paying Living Expenses: Not on file    Food Insecurity:   . Worried About Charity fundraiser in the Last Year: Not on file  . Ran Out of Food in the Last Year: Not on file  Transportation Needs:   . Lack of Transportation (Medical): Not on file  . Lack of Transportation (Non-Medical): Not on file  Physical Activity:   . Days of Exercise per Week: Not on file  . Minutes of Exercise per Session: Not on file  Stress:   . Feeling of Stress : Not on file  Social Connections:   . Frequency of Communication with Friends and Family: Not on file  . Frequency of Social Gatherings with Friends and Family: Not on file  . Attends Religious Services: Not on file  . Active Member of Clubs or Organizations: Not on file  . Attends Archivist Meetings: Not on file  . Marital Status: Not on file  Intimate Partner Violence:   . Fear of Current or Ex-Partner: Not on file  . Emotionally Abused: Not on file  . Physically Abused: Not on file  . Sexually Abused: Not on file    Family History  Problem Relation Age of Onset  . Heart disease Mother 84       cardiac stenting/CAD  . Hypertension Mother   . Lung cancer Mother   . Diabetes Mother   . Cancer Mother   . Stroke Father   . Alcohol abuse Father   . Arthritis Brother   . Anxiety disorder Daughter      Review of Systems  Constitutional: Negative.  Negative for chills and fever.  HENT: Negative.  Negative for congestion and sore throat.   Respiratory: Negative.  Negative for cough and shortness of breath.   Cardiovascular: Negative.  Negative for chest pain and palpitations.  Gastrointestinal: Positive for heartburn. Negative for abdominal pain, blood in stool, diarrhea, melena, nausea and vomiting.  Genitourinary: Negative.  Negative for dysuria and hematuria.  Musculoskeletal: Negative.  Negative for back pain, myalgias and neck pain.  Skin: Negative.  Negative for rash.  Neurological: Negative.  Negative for dizziness and headaches.  All other systems reviewed  and are negative.   Today's Vitals   07/01/20 1401  BP: (!) 146/84  Pulse: 70  Resp: 16  Temp: 98.2 F (36.8 C)  TempSrc: Temporal  SpO2: 98%  Weight: 136 lb (61.7 kg)  Height: 5\' 2"  (1.575 m)   Body mass index is 24.87 kg/m.  Physical Exam Vitals reviewed.  Constitutional:  Appearance: Normal appearance.  HENT:     Head: Normocephalic.     Right Ear: Tympanic membrane, ear canal and external ear normal.     Left Ear: Tympanic membrane, ear canal and external ear normal.     Mouth/Throat:     Mouth: Mucous membranes are moist.     Pharynx: Oropharynx is clear. No oropharyngeal exudate or posterior oropharyngeal erythema.  Eyes:     Extraocular Movements: Extraocular movements intact.     Conjunctiva/sclera: Conjunctivae normal.     Pupils: Pupils are equal, round, and reactive to light.  Cardiovascular:     Rate and Rhythm: Normal rate.     Pulses: Normal pulses.     Heart sounds: Normal heart sounds.  Pulmonary:     Effort: Pulmonary effort is normal.     Breath sounds: Normal breath sounds.  Abdominal:     General: Bowel sounds are normal. There is no distension.     Palpations: Abdomen is soft.     Tenderness: There is no abdominal tenderness.  Musculoskeletal:        General: Normal range of motion.     Cervical back: Normal range of motion and neck supple. No tenderness.  Lymphadenopathy:     Cervical: No cervical adenopathy.  Skin:    General: Skin is warm and dry.     Capillary Refill: Capillary refill takes less than 2 seconds.  Neurological:     General: No focal deficit present.     Mental Status: She is alert and oriented to person, place, and time.  Psychiatric:        Mood and Affect: Mood normal.        Behavior: Behavior normal.      ASSESSMENT & PLAN: Michell was seen today for gastroesophageal reflux and ear pain.  Diagnoses and all orders for this visit:  Gastroesophageal reflux disease without esophagitis -     famotidine  (PEPCID) 40 MG tablet; Take 1 tablet (40 mg total) by mouth daily.  Dizziness  Dysthymia    Patient Instructions       If you have lab work done today you will be contacted with your lab results within the next 2 weeks.  If you have not heard from [place] then please contact us. The fastest way to get your results is to register for My Chart.   IF you received an x-ray today, you will receive an invoice from [facility]. Please contact [facility] at [phone] with questions or concerns regarding your invoice.   IF you received labwork today, you will receive an invoice from [facility]. Please contact [facility] at [phone] with questions or concerns regarding your invoice.   Our billing staff will not be able to assist you with questions regarding bills from these companies.  You will be contacted with the lab results as soon as they are available. The fastest way to get your results is to activate your My Chart account. Instructions are located on the last page of this paperwork. If you have not heard from [place] regarding the results in 2 weeks, please contact this office.     Health Maintenance After Age 57 After age 74, you are at a higher risk for certain long-term diseases and infections as well as injuries from falls. Falls are a major cause of broken bones and head injuries in people who are older than age 53. Getting regular preventive care can help to keep you healthy and well. Preventive care includes getting  regular testing and making lifestyle changes as recommended by your health care provider. Talk with your health care provider about:  Which screenings and tests you should have. A screening is a test that checks for a disease when you have no symptoms.  A diet and exercise plan that is right for you. What should I know about screenings and tests to prevent falls? Screening and testing are the best ways to find a health problem early. Early diagnosis and  treatment give you the best chance of managing medical conditions that are common after age 53. Certain conditions and lifestyle choices may make you more likely to have a fall. Your health care provider may recommend:  Regular vision checks. Poor vision and conditions such as cataracts can make you more likely to have a fall. If you wear glasses, make sure to get your prescription updated if your vision changes.  Medicine review. Work with your health care provider to regularly review all of the medicines you are taking, including over-the-counter medicines. Ask your health care provider about any side effects that may make you more likely to have a fall. Tell your health care provider if any medicines that you take make you feel dizzy or sleepy.  Osteoporosis screening. Osteoporosis is a condition that causes the bones to get weaker. This can make the bones weak and cause them to break more easily.  Blood pressure screening. Blood pressure changes and medicines to control blood pressure can make you feel dizzy.  Strength and balance checks. Your health care provider may recommend certain tests to check your strength and balance while standing, walking, or changing positions.  Foot health exam. Foot pain and numbness, as well as not wearing proper footwear, can make you more likely to have a fall.  Depression screening. You may be more likely to have a fall if you have a fear of falling, feel emotionally low, or feel unable to do activities that you used to do.  Alcohol use screening. Using too much alcohol can affect your balance and may make you more likely to have a fall. What actions can I take to lower my risk of falls? General instructions  Talk with your health care provider about your risks for falling. Tell your health care provider if: ? You fall. Be sure to tell your health care provider about all falls, even ones that seem minor. ? You feel dizzy, sleepy, or off-balance.  Take  over-the-counter and prescription medicines only as told by your health care provider. These include any supplements.  Eat a healthy diet and maintain a healthy weight. A healthy diet includes low-fat dairy products, low-fat (lean) meats, and fiber from whole grains, beans, and lots of fruits and vegetables. Home safety  Remove any tripping hazards, such as rugs, cords, and clutter.  Install safety equipment such as grab bars in bathrooms and safety rails on stairs.  Keep rooms and walkways well-lit. Activity   Follow a regular exercise program to stay fit. This will help you maintain your balance. Ask your health care provider what types of exercise are appropriate for you.  If you need a cane or walker, use it as recommended by your health care provider.  Wear supportive shoes that have nonskid soles. Lifestyle  Do not drink alcohol if your health care provider tells you not to drink.  If you drink alcohol, limit how much you have: ? 0-1 drink a day for women. ? 0-2 drinks a day for men.  Be  aware of how much alcohol is in your drink. In the [place] one drink equals one typical bottle of beer (12 oz), one-half glass of wine (5 oz), or one shot of hard liquor (1 oz).  Do not use any products that contain nicotine or tobacco, such as cigarettes and e-cigarettes. If you need help quitting, ask your health care provider. Summary  Having a healthy lifestyle and getting preventive care can help to protect your health and wellness after age 66.  Screening and testing are the best way to find a health problem early and help you avoid having a fall. Early diagnosis and treatment give you the best chance for managing medical conditions that are more common for people who are older than age 83.  Falls are a major cause of broken bones and head injuries in people who are older than age 16. Take precautions to prevent a fall at home.  Work with your health care provider to learn what changes  you can make to improve your health and wellness and to prevent falls. This information is not intended to replace advice given to you by your health care provider. Make sure you discuss any questions you have with your health care provider. Document Revised: 12/13/2018 Document Reviewed: 07/05/2017 Elsevier Patient Education  [place].  Gastroesophageal Reflux Disease, Adult Gastroesophageal reflux (GER) happens when acid from the stomach flows up into the tube that connects the mouth and the stomach (esophagus). Normally, food travels down the esophagus and stays in the stomach to be digested. With GER, food and stomach acid sometimes move back up into the esophagus. You may have a disease called gastroesophageal reflux disease (GERD) if the reflux:  Happens often.  Causes frequent or very bad symptoms.  Causes problems such as damage to the esophagus. When this happens, the esophagus becomes sore and swollen (inflamed). Over time, GERD can make small holes (ulcers) in the lining of the esophagus. What are the causes? This condition is caused by a problem with the muscle between the esophagus and the stomach. When this muscle is weak or not normal, it does not close properly to keep food and acid from coming back up from the stomach. The muscle can be weak because of:  Tobacco use.  Pregnancy.  Having a certain type of hernia (hiatal hernia).  Alcohol use.  Certain foods and drinks, such as coffee, chocolate, onions, and peppermint. What increases the risk? You are more likely to develop this condition if you:  Are overweight.  Have a disease that affects your connective tissue.  Use NSAID medicines. What are the signs or symptoms? Symptoms of this condition include:  Heartburn.  Difficult or painful swallowing.  The feeling of having a lump in the throat.  A bitter taste in the mouth.  Bad breath.  Having a lot of saliva.  Having an upset or bloated  stomach.  Belching.  Chest pain. Different conditions can cause chest pain. Make sure you see your doctor if you have chest pain.  Shortness of breath or noisy breathing (wheezing).  Ongoing (chronic) cough or a cough at night.  Wearing away of the surface of teeth (tooth enamel).  Weight loss. How is this treated? Treatment will depend on how bad your symptoms are. Your doctor may suggest:  Changes to your diet.  Medicine.  Surgery. Follow these instructions at home: Eating and drinking   Follow a diet as told by your doctor. You may need to avoid foods and  drinks such as: ? Coffee and tea (with or without caffeine). ? Drinks that contain alcohol. ? Energy drinks and sports drinks. ? Bubbly (carbonated) drinks or sodas. ? Chocolate and cocoa. ? Peppermint and mint flavorings. ? Garlic and onions. ? Horseradish. ? Spicy and acidic foods. These include peppers, chili powder, curry powder, vinegar, hot sauces, and BBQ sauce. ? Citrus fruit juices and citrus fruits, such as oranges, lemons, and limes. ? Tomato-based foods. These include red sauce, chili, salsa, and pizza with red sauce. ? Fried and fatty foods. These include donuts, french fries, potato chips, and high-fat dressings. ? High-fat meats. These include hot dogs, rib eye steak, sausage, ham, and bacon. ? High-fat dairy items, such as whole milk, butter, and cream cheese.  Eat small meals often. Avoid eating large meals.  Avoid drinking large amounts of liquid with your meals.  Avoid eating meals during the 2-3 hours before bedtime.  Avoid lying down right after you eat.  Do not exercise right after you eat. Lifestyle   Do not use any products that contain nicotine or tobacco. These include cigarettes, e-cigarettes, and chewing tobacco. If you need help quitting, ask your doctor.  Try to lower your stress. If you need help doing this, ask your doctor.  If you are overweight, lose an amount of weight  that is healthy for you. Ask your doctor about a safe weight loss goal. General instructions  Pay attention to any changes in your symptoms.  Take over-the-counter and prescription medicines only as told by your doctor. Do not take aspirin, ibuprofen, or other NSAIDs unless your doctor says it is okay.  Wear loose clothes. Do not wear anything tight around your waist.  Raise (elevate) the head of your bed about 6 inches (15 cm).  Avoid bending over if this makes your symptoms worse.  Keep all follow-up visits as told by your doctor. This is important. Contact a doctor if:  You have new symptoms.  You lose weight and you do not know why.  You have trouble swallowing or it hurts to swallow.  You have wheezing or a cough that keeps happening.  Your symptoms do not get better with treatment.  You have a hoarse voice. Get help right away if:  You have pain in your arms, neck, jaw, teeth, or back.  You feel sweaty, dizzy, or light-headed.  You have chest pain or shortness of breath.  You throw up (vomit) and your throw-up looks like blood or coffee grounds.  You pass out (faint).  Your poop (stool) is bloody or black.  You cannot swallow, drink, or eat. Summary  If a person has gastroesophageal reflux disease (GERD), food and stomach acid move back up into the esophagus and cause symptoms or problems such as damage to the esophagus.  Treatment will depend on how bad your symptoms are.  Follow a diet as told by your doctor.  Take all medicines only as told by your doctor. This information is not intended to replace advice given to you by your health care provider. Make sure you discuss any questions you have with your health care provider. Document Revised: 02/28/2018 Document Reviewed: 02/28/2018 Elsevier Patient Education  2020 [employer]      Agustina Caroli, MD [facility]

## 2020-07-01 NOTE — Patient Instructions (Addendum)
If you have lab work done today you will be contacted with your lab results within the next 2 weeks.  If you have not heard from [place] then please contact us. The fastest way to get your results is to register for My Chart.   IF you received an x-ray today, you will receive an invoice from [facility]. Please contact [facility] at [phone] with questions or concerns regarding your invoice.   IF you received labwork today, you will receive an invoice from [facility]. Please contact [facility] at [phone] with questions or concerns regarding your invoice.   Our billing staff will not be able to assist you with questions regarding bills from these companies.  You will be contacted with the lab results as soon as they are available. The fastest way to get your results is to activate your My Chart account. Instructions are located on the last page of this paperwork. If you have not heard from [place] regarding the results in 2 weeks, please contact this office.     Health Maintenance After Age 60 After age 65, you are at a higher risk for certain long-term diseases and infections as well as injuries from falls. Falls are a major cause of broken bones and head injuries in people who are older than age 12. Getting regular preventive care can help to keep you healthy and well. Preventive care includes getting regular testing and making lifestyle changes as recommended by your health care provider. Talk with your health care provider about:  Which screenings and tests you should have. A screening is a test that checks for a disease when you have no symptoms.  A diet and exercise plan that is right for you. What should I know about screenings and tests to prevent falls? Screening and testing are the best ways to find a health problem early. Early diagnosis and treatment give you the best chance of managing medical conditions that are common after age 80. Certain conditions and  lifestyle choices may make you more likely to have a fall. Your health care provider may recommend:  Regular vision checks. Poor vision and conditions such as cataracts can make you more likely to have a fall. If you wear glasses, make sure to get your prescription updated if your vision changes.  Medicine review. Work with your health care provider to regularly review all of the medicines you are taking, including over-the-counter medicines. Ask your health care provider about any side effects that may make you more likely to have a fall. Tell your health care provider if any medicines that you take make you feel dizzy or sleepy.  Osteoporosis screening. Osteoporosis is a condition that causes the bones to get weaker. This can make the bones weak and cause them to break more easily.  Blood pressure screening. Blood pressure changes and medicines to control blood pressure can make you feel dizzy.  Strength and balance checks. Your health care provider may recommend certain tests to check your strength and balance while standing, walking, or changing positions.  Foot health exam. Foot pain and numbness, as well as not wearing proper footwear, can make you more likely to have a fall.  Depression screening. You may be more likely to have a fall if you have a fear of falling, feel emotionally low, or feel unable to do activities that you used to do.  Alcohol use screening. Using too much alcohol can affect your balance and may make you more likely to  have a fall. What actions can I take to lower my risk of falls? General instructions  Talk with your health care provider about your risks for falling. Tell your health care provider if: ? You fall. Be sure to tell your health care provider about all falls, even ones that seem minor. ? You feel dizzy, sleepy, or off-balance.  Take over-the-counter and prescription medicines only as told by your health care provider. These include any  supplements.  Eat a healthy diet and maintain a healthy weight. A healthy diet includes low-fat dairy products, low-fat (lean) meats, and fiber from whole grains, beans, and lots of fruits and vegetables. Home safety  Remove any tripping hazards, such as rugs, cords, and clutter.  Install safety equipment such as grab bars in bathrooms and safety rails on stairs.  Keep rooms and walkways well-lit. Activity   Follow a regular exercise program to stay fit. This will help you maintain your balance. Ask your health care provider what types of exercise are appropriate for you.  If you need a cane or walker, use it as recommended by your health care provider.  Wear supportive shoes that have nonskid soles. Lifestyle  Do not drink alcohol if your health care provider tells you not to drink.  If you drink alcohol, limit how much you have: ? 0-1 drink a day for women. ? 0-2 drinks a day for men.  Be aware of how much alcohol is in your drink. In the [place] one drink equals one typical bottle of beer (12 oz), one-half glass of wine (5 oz), or one shot of hard liquor (1 oz).  Do not use any products that contain nicotine or tobacco, such as cigarettes and e-cigarettes. If you need help quitting, ask your health care provider. Summary  Having a healthy lifestyle and getting preventive care can help to protect your health and wellness after age 67.  Screening and testing are the best way to find a health problem early and help you avoid having a fall. Early diagnosis and treatment give you the best chance for managing medical conditions that are more common for people who are older than age 60.  Falls are a major cause of broken bones and head injuries in people who are older than age 37. Take precautions to prevent a fall at home.  Work with your health care provider to learn what changes you can make to improve your health and wellness and to prevent falls. This information is not intended  to replace advice given to you by your health care provider. Make sure you discuss any questions you have with your health care provider. Document Revised: 12/13/2018 Document Reviewed: 07/05/2017 Elsevier Patient Education  [place].  Gastroesophageal Reflux Disease, Adult Gastroesophageal reflux (GER) happens when acid from the stomach flows up into the tube that connects the mouth and the stomach (esophagus). Normally, food travels down the esophagus and stays in the stomach to be digested. With GER, food and stomach acid sometimes move back up into the esophagus. You may have a disease called gastroesophageal reflux disease (GERD) if the reflux:  Happens often.  Causes frequent or very bad symptoms.  Causes problems such as damage to the esophagus. When this happens, the esophagus becomes sore and swollen (inflamed). Over time, GERD can make small holes (ulcers) in the lining of the esophagus. What are the causes? This condition is caused by a problem with the muscle between the esophagus and the stomach. When this muscle  is weak or not normal, it does not close properly to keep food and acid from coming back up from the stomach. The muscle can be weak because of:  Tobacco use.  Pregnancy.  Having a certain type of hernia (hiatal hernia).  Alcohol use.  Certain foods and drinks, such as coffee, chocolate, onions, and peppermint. What increases the risk? You are more likely to develop this condition if you:  Are overweight.  Have a disease that affects your connective tissue.  Use NSAID medicines. What are the signs or symptoms? Symptoms of this condition include:  Heartburn.  Difficult or painful swallowing.  The feeling of having a lump in the throat.  A bitter taste in the mouth.  Bad breath.  Having a lot of saliva.  Having an upset or bloated stomach.  Belching.  Chest pain. Different conditions can cause chest pain. Make sure you see your doctor  if you have chest pain.  Shortness of breath or noisy breathing (wheezing).  Ongoing (chronic) cough or a cough at night.  Wearing away of the surface of teeth (tooth enamel).  Weight loss. How is this treated? Treatment will depend on how bad your symptoms are. Your doctor may suggest:  Changes to your diet.  Medicine.  Surgery. Follow these instructions at home: Eating and drinking   Follow a diet as told by your doctor. You may need to avoid foods and drinks such as: ? Coffee and tea (with or without caffeine). ? Drinks that contain alcohol. ? Energy drinks and sports drinks. ? Bubbly (carbonated) drinks or sodas. ? Chocolate and cocoa. ? Peppermint and mint flavorings. ? Garlic and onions. ? Horseradish. ? Spicy and acidic foods. These include peppers, chili powder, curry powder, vinegar, hot sauces, and BBQ sauce. ? Citrus fruit juices and citrus fruits, such as oranges, lemons, and limes. ? Tomato-based foods. These include red sauce, chili, salsa, and pizza with red sauce. ? Fried and fatty foods. These include donuts, french fries, potato chips, and high-fat dressings. ? High-fat meats. These include hot dogs, rib eye steak, sausage, ham, and bacon. ? High-fat dairy items, such as whole milk, butter, and cream cheese.  Eat small meals often. Avoid eating large meals.  Avoid drinking large amounts of liquid with your meals.  Avoid eating meals during the 2-3 hours before bedtime.  Avoid lying down right after you eat.  Do not exercise right after you eat. Lifestyle   Do not use any products that contain nicotine or tobacco. These include cigarettes, e-cigarettes, and chewing tobacco. If you need help quitting, ask your doctor.  Try to lower your stress. If you need help doing this, ask your doctor.  If you are overweight, lose an amount of weight that is healthy for you. Ask your doctor about a safe weight loss goal. General instructions  Pay attention  to any changes in your symptoms.  Take over-the-counter and prescription medicines only as told by your doctor. Do not take aspirin, ibuprofen, or other NSAIDs unless your doctor says it is okay.  Wear loose clothes. Do not wear anything tight around your waist.  Raise (elevate) the head of your bed about 6 inches (15 cm).  Avoid bending over if this makes your symptoms worse.  Keep all follow-up visits as told by your doctor. This is important. Contact a doctor if:  You have new symptoms.  You lose weight and you do not know why.  You have trouble swallowing or it hurts to swallow.  You have wheezing or a cough that keeps happening.  Your symptoms do not get better with treatment.  You have a hoarse voice. Get help right away if:  You have pain in your arms, neck, jaw, teeth, or back.  You feel sweaty, dizzy, or light-headed.  You have chest pain or shortness of breath.  You throw up (vomit) and your throw-up looks like blood or coffee grounds.  You pass out (faint).  Your poop (stool) is bloody or black.  You cannot swallow, drink, or eat. Summary  If a person has gastroesophageal reflux disease (GERD), food and stomach acid move back up into the esophagus and cause symptoms or problems such as damage to the esophagus.  Treatment will depend on how bad your symptoms are.  Follow a diet as told by your doctor.  Take all medicines only as told by your doctor. This information is not intended to replace advice given to you by your health care provider. Make sure you discuss any questions you have with your health care provider. Document Revised: 02/28/2018 Document Reviewed: 02/28/2018 Elsevier Patient Education  [place].

## 2020-08-12 DIAGNOSIS — Z6824 Body mass index (BMI) 24.0-24.9, adult: Secondary | ICD-10-CM | POA: Diagnosis not present

## 2020-08-12 DIAGNOSIS — N958 Other specified menopausal and perimenopausal disorders: Secondary | ICD-10-CM | POA: Diagnosis not present

## 2020-08-12 DIAGNOSIS — M8580 Other specified disorders of bone density and structure, unspecified site: Secondary | ICD-10-CM | POA: Insufficient documentation

## 2020-08-12 DIAGNOSIS — N952 Postmenopausal atrophic vaginitis: Secondary | ICD-10-CM | POA: Diagnosis not present

## 2020-08-12 DIAGNOSIS — Z124 Encounter for screening for malignant neoplasm of cervix: Secondary | ICD-10-CM | POA: Diagnosis not present

## 2020-08-12 DIAGNOSIS — M8588 Other specified disorders of bone density and structure, other site: Secondary | ICD-10-CM | POA: Diagnosis not present

## 2020-08-12 DIAGNOSIS — Z1231 Encounter for screening mammogram for malignant neoplasm of breast: Secondary | ICD-10-CM | POA: Diagnosis not present

## 2020-11-12 DIAGNOSIS — H5213 Myopia, bilateral: Secondary | ICD-10-CM | POA: Diagnosis not present

## 2020-11-18 ENCOUNTER — Other Ambulatory Visit: Payer: Self-pay

## 2020-11-18 ENCOUNTER — Ambulatory Visit (INDEPENDENT_AMBULATORY_CARE_PROVIDER_SITE_OTHER): Payer: Medicare HMO | Admitting: Emergency Medicine

## 2020-11-18 ENCOUNTER — Encounter: Payer: Self-pay | Admitting: Emergency Medicine

## 2020-11-18 VITALS — BP 159/83 | HR 54 | Temp 97.6°F | Resp 16 | Ht 62.0 in | Wt 139.0 lb

## 2020-11-18 DIAGNOSIS — F341 Dysthymic disorder: Secondary | ICD-10-CM | POA: Diagnosis not present

## 2020-11-18 DIAGNOSIS — M19011 Primary osteoarthritis, right shoulder: Secondary | ICD-10-CM | POA: Diagnosis not present

## 2020-11-18 DIAGNOSIS — K5790 Diverticulosis of intestine, part unspecified, without perforation or abscess without bleeding: Secondary | ICD-10-CM | POA: Diagnosis not present

## 2020-11-18 DIAGNOSIS — F410 Panic disorder [episodic paroxysmal anxiety] without agoraphobia: Secondary | ICD-10-CM

## 2020-11-18 DIAGNOSIS — K2100 Gastro-esophageal reflux disease with esophagitis, without bleeding: Secondary | ICD-10-CM

## 2020-11-18 DIAGNOSIS — R69 Illness, unspecified: Secondary | ICD-10-CM | POA: Diagnosis not present

## 2020-11-18 MED ORDER — ALPRAZOLAM 0.5 MG PO TABS: 0.5000 mg | ORAL_TABLET | Freq: Every day | ORAL | 1 refills | Status: DC | PRN

## 2020-11-18 MED ORDER — ESCITALOPRAM OXALATE 20 MG PO TABS: 20.0000 mg | ORAL_TABLET | Freq: Every day | ORAL | 3 refills | Status: AC

## 2020-11-18 MED ORDER — PANTOPRAZOLE SODIUM 40 MG PO TBEC: 40.0000 mg | DELAYED_RELEASE_TABLET | Freq: Every day | ORAL | 3 refills | Status: AC

## 2020-11-18 NOTE — Patient Instructions (Addendum)
   If you have lab work done today you will be contacted with your lab results within the next 2 weeks.  If you have not heard from us then please contact us. The fastest way to get your results is to register for My Chart.   IF you received an x-ray today, you will receive an invoice from  Radiology. Please contact  Radiology at [phone] with questions or concerns regarding your invoice.   IF you received labwork today, you will receive an invoice from [facility]. Please contact [facility] at [phone] with questions or concerns regarding your invoice.   Our billing staff will not be able to assist you with questions regarding bills from these companies.  You will be contacted with the lab results as soon as they are available. The fastest way to get your results is to activate your My Chart account. Instructions are located on the last page of this paperwork. If you have not heard from us regarding the results in 2 weeks, please contact this office.       Health Maintenance After Age 65 After age 65, you are at a higher risk for certain long-term diseases and infections as well as injuries from falls. Falls are a major cause of broken bones and head injuries in people who are older than age 65. Getting regular preventive care can help to keep you healthy and well. Preventive care includes getting regular testing and making lifestyle changes as recommended by your health care provider. Talk with your health care provider about:  Which screenings and tests you should have. A screening is a test that checks for a disease when you have no symptoms.  A diet and exercise plan that is right for you. What should I know about screenings and tests to prevent falls? Screening and testing are the best ways to find a health problem early. Early diagnosis and treatment give you the best chance of managing medical conditions that are common after age 65. Certain conditions and  lifestyle choices may make you more likely to have a fall. Your health care provider may recommend:  Regular vision checks. Poor vision and conditions such as cataracts can make you more likely to have a fall. If you wear glasses, make sure to get your prescription updated if your vision changes.  Medicine review. Work with your health care provider to regularly review all of the medicines you are taking, including over-the-counter medicines. Ask your health care provider about any side effects that may make you more likely to have a fall. Tell your health care provider if any medicines that you take make you feel dizzy or sleepy.  Osteoporosis screening. Osteoporosis is a condition that causes the bones to get weaker. This can make the bones weak and cause them to break more easily.  Blood pressure screening. Blood pressure changes and medicines to control blood pressure can make you feel dizzy.  Strength and balance checks. Your health care provider may recommend certain tests to check your strength and balance while standing, walking, or changing positions.  Foot health exam. Foot pain and numbness, as well as not wearing proper footwear, can make you more likely to have a fall.  Depression screening. You may be more likely to have a fall if you have a fear of falling, feel emotionally low, or feel unable to do activities that you used to do.  Alcohol use screening. Using too much alcohol can affect your balance and may make you more   likely to have a fall. What actions can I take to lower my risk of falls? General instructions  Talk with your health care provider about your risks for falling. Tell your health care provider if: ? You fall. Be sure to tell your health care provider about all falls, even ones that seem minor. ? You feel dizzy, sleepy, or off-balance.  Take over-the-counter and prescription medicines only as told by your health care provider. These include any  supplements.  Eat a healthy diet and maintain a healthy weight. A healthy diet includes low-fat dairy products, low-fat (lean) meats, and fiber from whole grains, beans, and lots of fruits and vegetables. Home safety  Remove any tripping hazards, such as rugs, cords, and clutter.  Install safety equipment such as grab bars in bathrooms and safety rails on stairs.  Keep rooms and walkways well-lit. Activity  Follow a regular exercise program to stay fit. This will help you maintain your balance. Ask your health care provider what types of exercise are appropriate for you.  If you need a cane or walker, use it as recommended by your health care provider.  Wear supportive shoes that have nonskid soles.   Lifestyle  Do not drink alcohol if your health care provider tells you not to drink.  If you drink alcohol, limit how much you have: ? 0-1 drink a day for women. ? 0-2 drinks a day for men.  Be aware of how much alcohol is in your drink. In the [place] one drink equals one typical bottle of beer (12 oz), one-half glass of wine (5 oz), or one shot of hard liquor (1 oz).  Do not use any products that contain nicotine or tobacco, such as cigarettes and e-cigarettes. If you need help quitting, ask your health care provider. Summary  Having a healthy lifestyle and getting preventive care can help to protect your health and wellness after age 65.  Screening and testing are the best way to find a health problem early and help you avoid having a fall. Early diagnosis and treatment give you the best chance for managing medical conditions that are more common for people who are older than age 65.  Falls are a major cause of broken bones and head injuries in people who are older than age 65. Take precautions to prevent a fall at home.  Work with your health care provider to learn what changes you can make to improve your health and wellness and to prevent falls. This information is not intended  to replace advice given to you by your health care provider. Make sure you discuss any questions you have with your health care provider. Document Revised: 12/13/2018 Document Reviewed: 07/05/2017 Elsevier Patient Education  2021 [employer]  

## 2020-11-18 NOTE — Progress Notes (Signed)
Thayer Headings L Scales Outlaw 70 y.o.   Chief Complaint  Patient presents with  . Medication Refill    Alprazolam, escitalopram and pantoprazole    HISTORY OF PRESENT ILLNESS: This is a 70 y.o. female with history of generalized anxiety disorder and GERD here for follow-up and medication refill. Doing well.  Has no complaints or medical concerns today. Health maintenance items reviewed with patient.  Last visit with gynecologist on 08/12/2020.  Normal mammogram last December. Has history of arthritis.  Last saw orthopedist on 05/26/2020. History of GERD.  States she has been on pantoprazole for over 20 years. Colonoscopy on 12/11/2015 as follows: - One 5 mm polyp in the sigmoid colon, removed with a cold biopsy forceps. Resected and retrieved. - Diverticulosis in the sigmoid colon.  HPI   Prior to Admission medications   Medication Sig Start Date End Date Taking? Authorizing Provider  b complex vitamins tablet Take 1 tablet by mouth daily.   Yes [provider]  BIOTIN 5000 PO Take by mouth daily.   Yes [provider]  cholecalciferol (VITAMIN D) 1000 UNITS tablet Take 1,000 Units by mouth daily.   Yes [provider]  Multiple Vitamin (MULTIVITAMIN) tablet Take 1 tablet by mouth daily.   Yes [provider]  ALPRAZolam Duanne Moron) 0.5 MG tablet Take 1 tablet (0.5 mg total) by mouth daily as needed for anxiety. Use sparingly and take for panic symptoms only. 11/18/20   Horald Pollen, MD  escitalopram (LEXAPRO) 20 MG tablet Take 1 tablet (20 mg total) by mouth daily. 11/18/20 02/16/21  Horald Pollen, MD  pantoprazole (PROTONIX) 40 MG tablet Take 1 tablet (40 mg total) by mouth daily. Take 30 minutes before breakfast daily. 11/18/20   Horald Pollen, MD    Allergies  Allergen Reactions  . Erythromycin Anaphylaxis  . Demerol Other (See Comments)    blisters  . Penicillins Hives  . Sulfa Antibiotics Hives    Patient Active Problem List    Diagnosis Date Noted  . IBS (irritable bowel syndrome) 10/26/2015  . Anxiety 11/14/2011  . GERD (gastroesophageal reflux disease) 11/14/2011    Past Medical History:  Diagnosis Date  . Allergy   . Anxiety   . Asthma   . Depression   . GERD (gastroesophageal reflux disease)   . IBS (irritable bowel syndrome)   . Kidney stones   . Meningitis   . Mitral valve prolapse   . Osteopenia   . Pneumonia     Past Surgical History:  Procedure Laterality Date  . APPENDECTOMY    . CARPAL TUNNEL RELEASE     right and left wrist, right x 3  . CHOLECYSTECTOMY    . COLONOSCOPY  09/05/1998   normal; repeat in ten years.  Marland Kitchen COSMETIC SURGERY     eyelids  . KNEE ARTHROSCOPY Left   . orthoscopic shoulder     right shoulder  . SPINE SURGERY     cervical spine C5-6  . TONSILLECTOMY    . TUBAL LIGATION      Social History   Socioeconomic History  . Marital status: Married    Spouse name: Not on file  . Number of children: 2  . Years of education: Not on file  . Highest education level: Not on file  Occupational History  . Occupation: retired  Tobacco Use  . Smoking status: Never Smoker  . Smokeless tobacco: Never Used  Vaping Use  . Vaping Use: Never used  Substance and Sexual  Activity  . Alcohol use: Yes    Comment: occ  . Drug use: No  . Sexual activity: Yes    Birth control/protection: None  Other Topics Concern  . Not on file  Social History Narrative   Marital status:  Married x 14 years; happily married.  No abuse.  Moved from [place].      Children:  2 daughters; 1 grandson.      Lives:  With husband.      Employment:  Sales promotion account executive.        Tobacco: none      Alcohol: 4 glasses of wine weekly.      Drugs:none      Exercise: yes; walking and running   Social Determinants of Health   Financial Resource Strain: Not on file  Food Insecurity: Not on file  Transportation Needs: Not on file  Physical Activity: Not on file  Stress: Not on file   Social Connections: Not on file  Intimate Partner Violence: Not on file    Family History  Problem Relation Age of Onset  . Heart disease Mother 16       cardiac stenting/CAD  . Hypertension Mother   . Lung cancer Mother   . Diabetes Mother   . Cancer Mother   . Stroke Father   . Alcohol abuse Father   . Arthritis Brother   . Anxiety disorder Daughter      Review of Systems  Constitutional: Negative.  Negative for chills and fever.  HENT: Negative.  Negative for congestion and sore throat.   Respiratory: Negative.  Negative for cough and shortness of breath.   Cardiovascular: Negative.  Negative for chest pain and palpitations.  Gastrointestinal: Positive for heartburn. Negative for abdominal pain, diarrhea, nausea and vomiting.  Genitourinary: Negative.  Negative for dysuria and hematuria.  Musculoskeletal: Positive for joint pain.  Skin: Negative.  Negative for rash.  Neurological: Negative.  Negative for dizziness and headaches.  All other systems reviewed and are negative.  Today's Vitals   11/18/20 1416  BP: (!) 159/83  Pulse: (!) 54  Resp: 16  Temp: 97.6 F (36.4 C)  TempSrc: Temporal  SpO2: 98%  Weight: 139 lb (63 kg)  Height: 5\' 2"  (1.575 m)   Body mass index is 25.42 kg/m. Wt Readings from Last 3 Encounters:  11/18/20 139 lb (63 kg)  07/01/20 136 lb (61.7 kg)  01/01/20 132 lb 6.4 oz (60.1 kg)     Physical Exam Vitals reviewed.  Constitutional:      Appearance: Normal appearance.  HENT:     Head: Normocephalic.  Eyes:     Extraocular Movements: Extraocular movements intact.     Conjunctiva/sclera: Conjunctivae normal.     Pupils: Pupils are equal, round, and reactive to light.  Neck:     Vascular: No carotid bruit.  Cardiovascular:     Rate and Rhythm: Normal rate and regular rhythm.     Pulses: Normal pulses.     Heart sounds: Normal heart sounds.  Pulmonary:     Effort: Pulmonary effort is normal.     Breath sounds: Normal breath  sounds.  Musculoskeletal:        General: Normal range of motion.     Cervical back: Normal range of motion and neck supple. No tenderness.  Skin:    General: Skin is warm and dry.     Capillary Refill: Capillary refill takes less than 2 seconds.  Neurological:     General: No focal  deficit present.     Mental Status: She is alert and oriented to person, place, and time.  Psychiatric:        Mood and Affect: Mood normal.        Behavior: Behavior normal.    A total of 30 minutes was spent with the patient, greater than 50% of which was in counseling/coordination of care regarding multiple chronic medical problems, treatment and management, review of all medications, review of most recent office visit notes, review of most recent blood work results, education on nutrition, prognosis, documentation, need for follow-up.   ASSESSMENT & PLAN: Clinically stable.  No medical concerns identified during this visit. Continue present medications.  No changes. Follow-up in 6 months. Pamalee was seen today for medication refill.  Diagnoses and all orders for this visit:  Dysthymia Comments: Controlled. Continue current plan.  Orders: -     escitalopram (LEXAPRO) 20 MG tablet; Take 1 tablet (20 mg total) by mouth daily.  Panic attack -     ALPRAZolam (XANAX) 0.5 MG tablet; Take 1 tablet (0.5 mg total) by mouth daily as needed for anxiety. Use sparingly and take for panic symptoms only.  Gastroesophageal reflux disease with esophagitis, unspecified whether hemorrhage -     pantoprazole (PROTONIX) 40 MG tablet; Take 1 tablet (40 mg total) by mouth daily. Take 30 minutes before breakfast daily.  Arthritis of right shoulder region  Diverticulosis    Patient Instructions       If you have lab work done today you will be contacted with your lab results within the next 2 weeks.  If you have not heard from [place] then please contact us. The fastest way to get your results is to register for My  Chart.   IF you received an x-ray today, you will receive an invoice from [facility]. Please contact [facility] at [phone] with questions or concerns regarding your invoice.   IF you received labwork today, you will receive an invoice from [place]. Please contact [facility] at [phone] with questions or concerns regarding your invoice.   Our billing staff will not be able to assist you with questions regarding bills from these companies.  You will be contacted with the lab results as soon as they are available. The fastest way to get your results is to activate your My Chart account. Instructions are located on the last page of this paperwork. If you have not heard from [place] regarding the results in 2 weeks, please contact this office.     Health Maintenance After Age 28 After age 8, you are at a higher risk for certain long-term diseases and infections as well as injuries from falls. Falls are a major cause of broken bones and head injuries in people who are older than age 81. Getting regular preventive care can help to keep you healthy and well. Preventive care includes getting regular testing and making lifestyle changes as recommended by your health care provider. Talk with your health care provider about:  Which screenings and tests you should have. A screening is a test that checks for a disease when you have no symptoms.  A diet and exercise plan that is right for you. What should I know about screenings and tests to prevent falls? Screening and testing are the best ways to find a health problem early. Early diagnosis and treatment give you the best chance of managing medical conditions that are common after age 61. Certain conditions and lifestyle choices may make you  more likely to have a fall. Your health care provider may recommend:  Regular vision checks. Poor vision and conditions such as cataracts can make you more likely to have a fall. If you wear  glasses, make sure to get your prescription updated if your vision changes.  Medicine review. Work with your health care provider to regularly review all of the medicines you are taking, including over-the-counter medicines. Ask your health care provider about any side effects that may make you more likely to have a fall. Tell your health care provider if any medicines that you take make you feel dizzy or sleepy.  Osteoporosis screening. Osteoporosis is a condition that causes the bones to get weaker. This can make the bones weak and cause them to break more easily.  Blood pressure screening. Blood pressure changes and medicines to control blood pressure can make you feel dizzy.  Strength and balance checks. Your health care provider may recommend certain tests to check your strength and balance while standing, walking, or changing positions.  Foot health exam. Foot pain and numbness, as well as not wearing proper footwear, can make you more likely to have a fall.  Depression screening. You may be more likely to have a fall if you have a fear of falling, feel emotionally low, or feel unable to do activities that you used to do.  Alcohol use screening. Using too much alcohol can affect your balance and may make you more likely to have a fall. What actions can I take to lower my risk of falls? General instructions  Talk with your health care provider about your risks for falling. Tell your health care provider if: ? You fall. Be sure to tell your health care provider about all falls, even ones that seem minor. ? You feel dizzy, sleepy, or off-balance.  Take over-the-counter and prescription medicines only as told by your health care provider. These include any supplements.  Eat a healthy diet and maintain a healthy weight. A healthy diet includes low-fat dairy products, low-fat (lean) meats, and fiber from whole grains, beans, and lots of fruits and vegetables. Home safety  Remove any  tripping hazards, such as rugs, cords, and clutter.  Install safety equipment such as grab bars in bathrooms and safety rails on stairs.  Keep rooms and walkways well-lit. Activity  Follow a regular exercise program to stay fit. This will help you maintain your balance. Ask your health care provider what types of exercise are appropriate for you.  If you need a cane or walker, use it as recommended by your health care provider.  Wear supportive shoes that have nonskid soles.   Lifestyle  Do not drink alcohol if your health care provider tells you not to drink.  If you drink alcohol, limit how much you have: ? 0-1 drink a day for women. ? 0-2 drinks a day for men.  Be aware of how much alcohol is in your drink. In the [place] one drink equals one typical bottle of beer (12 oz), one-half glass of wine (5 oz), or one shot of hard liquor (1 oz).  Do not use any products that contain nicotine or tobacco, such as cigarettes and e-cigarettes. If you need help quitting, ask your health care provider. Summary  Having a healthy lifestyle and getting preventive care can help to protect your health and wellness after age 52.  Screening and testing are the best way to find a health problem early and help you avoid having a fall.  Early diagnosis and treatment give you the best chance for managing medical conditions that are more common for people who are older than age 58.  Falls are a major cause of broken bones and head injuries in people who are older than age 10. Take precautions to prevent a fall at home.  Work with your health care provider to learn what changes you can make to improve your health and wellness and to prevent falls. This information is not intended to replace advice given to you by your health care provider. Make sure you discuss any questions you have with your health care provider. Document Revised: 12/13/2018 Document Reviewed: 07/05/2017 Elsevier Patient Education  2021  Elsevier Inc.     Agustina Caroli, MD [facility]

## 2020-11-23 ENCOUNTER — Other Ambulatory Visit: Payer: Self-pay | Admitting: Emergency Medicine

## 2020-11-23 DIAGNOSIS — F410 Panic disorder [episodic paroxysmal anxiety] without agoraphobia: Secondary | ICD-10-CM

## 2020-11-23 MED ORDER — ALPRAZOLAM 0.5 MG PO TABS: 0.5000 mg | ORAL_TABLET | Freq: Every day | ORAL | 1 refills | Status: AC | PRN

## 2020-11-23 NOTE — Telephone Encounter (Signed)
Requested medication (s) are due for refill today: yes  Requested medication (s) are on the active medication list: yes  Last refill: ?  Future visit scheduled: no  Notes to clinic:  not delegated; Patient called and stated that that [facility] did not receive medication refill of ALPRAZolam (XANAX) 0.5 MG tablet Can this be resent. Transmission on the medication said failed.    Requested Prescriptions  Pending Prescriptions Disp Refills   ALPRAZolam (XANAX) 0.5 MG tablet 30 tablet 1    Sig: Take 1 tablet (0.5 mg total) by mouth daily as needed for anxiety. Use sparingly and take for panic symptoms only.      There is no refill protocol information for this order

## 2020-11-23 NOTE — Addendum Note (Signed)
Addended by: Davina Poke on: 11/23/2020 11:42 AM   Modules accepted: Orders

## 2020-11-23 NOTE — Telephone Encounter (Signed)
Patient called and stated that that [facility] did not receive medication refill of ALPRAZolam (XANAX) 0.5 MG tablet Can this be resent. Transmission on the medication said failed. Please advise.

## 2020-12-03 ENCOUNTER — Ambulatory Visit: Payer: Medicare HMO | Admitting: Podiatry

## 2020-12-23 DIAGNOSIS — H18413 Arcus senilis, bilateral: Secondary | ICD-10-CM | POA: Diagnosis not present

## 2020-12-23 DIAGNOSIS — H26493 Other secondary cataract, bilateral: Secondary | ICD-10-CM | POA: Diagnosis not present

## 2020-12-23 DIAGNOSIS — Z961 Presence of intraocular lens: Secondary | ICD-10-CM | POA: Diagnosis not present

## 2020-12-23 DIAGNOSIS — H26492 Other secondary cataract, left eye: Secondary | ICD-10-CM | POA: Diagnosis not present

## 2020-12-23 DIAGNOSIS — H18612 Keratoconus, stable, left eye: Secondary | ICD-10-CM | POA: Diagnosis not present

## 2020-12-31 DIAGNOSIS — Z9842 Cataract extraction status, left eye: Secondary | ICD-10-CM | POA: Diagnosis not present

## 2021-03-15 DIAGNOSIS — H18612 Keratoconus, stable, left eye: Secondary | ICD-10-CM | POA: Diagnosis not present

## 2021-03-15 DIAGNOSIS — H26491 Other secondary cataract, right eye: Secondary | ICD-10-CM | POA: Diagnosis not present

## 2021-03-15 DIAGNOSIS — H18413 Arcus senilis, bilateral: Secondary | ICD-10-CM | POA: Diagnosis not present

## 2021-03-19 ENCOUNTER — Other Ambulatory Visit: Payer: Self-pay

## 2021-03-19 ENCOUNTER — Ambulatory Visit
Admission: EM | Admit: 2021-03-19 | Discharge: 2021-03-19 | Disposition: A | Discharge status: Home / Self Care | Payer: Medicare HMO | Attending: Emergency Medicine | Admitting: Emergency Medicine

## 2021-03-19 DIAGNOSIS — J019 Acute sinusitis, unspecified: Secondary | ICD-10-CM | POA: Diagnosis not present

## 2021-03-19 MED ORDER — DOXYCYCLINE HYCLATE 100 MG PO CAPS: 100.0000 mg | ORAL_CAPSULE | Freq: Two times a day (BID) | ORAL | 0 refills | Status: AC

## 2021-03-19 MED ORDER — PREDNISONE 20 MG PO TABS: 40.0000 mg | ORAL_TABLET | Freq: Every day | ORAL | 0 refills | Status: AC

## 2021-03-19 NOTE — Discharge Instructions (Addendum)
Begin doxycycline twice daily x1 week Prednisone 40 mg daily x5 days-take with food and earlier in the day if possible Continue Allegra, may supplement with nasal spray or Mucinex Rest and fluids Follow-up if not improving or worsening

## 2021-03-19 NOTE — ED Provider Notes (Signed)
[facility]    CSN: [id number] Arrival date & time: 03/19/21  1102      History   Chief Complaint Chief Complaint  Patient presents with   Headache    HPI Tina Bray Tina Bray is a 70 y.o. female history of GERD, IBS, presenting today for evaluation of sinus headache and congestion with associated dizziness.  Reports that over the past few months she has had sinus congestion and allergies.  Has been using Allegra and nasal sprays intermittently.  Of recently over the past couple weeks she has had worsening sinus congestion pressure and occasional dizziness.  She denies any significant cough, chest pain or shortness of breath.  Denies fevers.  HPI  Past Medical History:  Diagnosis Date   Allergy    Anxiety    Asthma    Depression    GERD (gastroesophageal reflux disease)    IBS (irritable bowel syndrome)    Kidney stones    Meningitis    Mitral valve prolapse    Osteopenia    Pneumonia     Patient Active Problem List   Diagnosis Date Noted   Diverticulosis 11/18/2020   Arthritis of right shoulder region 11/18/2020   Dysthymia 11/18/2020   Panic attack 11/18/2020   IBS (irritable bowel syndrome) 10/26/2015   Anxiety 11/14/2011   GERD (gastroesophageal reflux disease) 11/14/2011    Past Surgical History:  Procedure Laterality Date   APPENDECTOMY     CARPAL TUNNEL RELEASE     right and left wrist, right x 3   CHOLECYSTECTOMY     COLONOSCOPY  09/05/1998   normal; repeat in ten years.   COSMETIC SURGERY     eyelids   KNEE ARTHROSCOPY Left    orthoscopic shoulder     right shoulder   SPINE SURGERY     cervical spine C5-6   TONSILLECTOMY     TUBAL LIGATION      OB History   No obstetric history on file.      Home Medications    Prior to Admission medications   Medication Sig Start Date End Date Taking? Authorizing Provider  doxycycline (VIBRAMYCIN) 100 MG capsule Take 1 capsule (100 mg total) by mouth 2 (two) times daily for 7 days.  03/19/21 03/26/21 Yes Skyelynn Rambeau C, PA-C  predniSONE (DELTASONE) 20 MG tablet Take 2 tablets (40 mg total) by mouth daily with breakfast for 5 days. 03/19/21 03/24/21 Yes Fawna Cranmer C, PA-C  ALPRAZolam (XANAX) 0.5 MG tablet Take 1 tablet (0.5 mg total) by mouth daily as needed for anxiety. Use sparingly and take for panic symptoms only. 11/23/20   Horald Pollen, MD  b complex vitamins tablet Take 1 tablet by mouth daily.    [provider]  BIOTIN 5000 PO Take by mouth daily.    [provider]  cholecalciferol (VITAMIN D) 1000 UNITS tablet Take 1,000 Units by mouth daily.    [provider]  escitalopram (LEXAPRO) 20 MG tablet Take 1 tablet (20 mg total) by mouth daily. 11/18/20 02/16/21  Horald Pollen, MD  Multiple Vitamin (MULTIVITAMIN) tablet Take 1 tablet by mouth daily.    [provider]  pantoprazole (PROTONIX) 40 MG tablet Take 1 tablet (40 mg total) by mouth daily. Take 30 minutes before breakfast daily. 11/18/20   Horald Pollen, MD    Family History Family History  Problem Relation Age of Onset   Heart disease Mother 88       cardiac stenting/CAD  Hypertension Mother    Lung cancer Mother    Diabetes Mother    Cancer Mother    Stroke Father    Alcohol abuse Father    Arthritis Brother    Anxiety disorder Daughter     Social History Social History   Tobacco Use   Smoking status: Never   Smokeless tobacco: Never  Vaping Use   Vaping Use: Never used  Substance Use Topics   Alcohol use: Yes    Comment: occ   Drug use: No     Allergies   Erythromycin, Demerol, Penicillins, and Sulfa antibiotics   Review of Systems Review of Systems  Constitutional:  Negative for activity change, appetite change, chills, fatigue and fever.  HENT:  Positive for congestion, rhinorrhea and sinus pressure. Negative for ear pain, sore throat and trouble swallowing.   Eyes:  Negative for discharge and redness.   Respiratory:  Negative for cough, chest tightness and shortness of breath.   Cardiovascular:  Negative for chest pain.  Gastrointestinal:  Negative for abdominal pain, diarrhea, nausea and vomiting.  Musculoskeletal:  Negative for myalgias.  Skin:  Negative for rash.  Neurological:  Positive for dizziness and headaches. Negative for light-headedness.    Physical Exam Triage Vital Signs ED Triage Vitals  Enc Vitals Group     BP 03/19/21 1121 (!) 160/87     Pulse Rate 03/19/21 1121 (!) 59     Resp 03/19/21 1121 18     Temp 03/19/21 1121 98.9 F (37.2 C)     Temp Source 03/19/21 1121 Oral     SpO2 03/19/21 1121 98 %     Weight --      Height --      Head Circumference --      Peak Flow --      Pain Score 03/19/21 1120 5     Pain Loc --      Pain Edu? --      Excl. in [place]? --    No data found.  Updated Vital Signs BP (!) 160/87 (BP Location: Left Arm)   Pulse (!) 59   Temp 98.9 F (37.2 C) (Oral)   Resp 18   SpO2 98%   Visual Acuity Right Eye Distance:   Left Eye Distance:   Bilateral Distance:    Right Eye Near:   Left Eye Near:    Bilateral Near:     Physical Exam Vitals and nursing note reviewed.  Constitutional:      Appearance: She is well-developed.     Comments: No acute distress  HENT:     Head: Normocephalic and atraumatic.     Ears:     Comments: Bilateral ears without tenderness to palpation of external auricle, tragus and mastoid, EAC's without erythema or swelling, TM's with good bony landmarks and cone of light. Non erythematous.      Nose: Nose normal.     Mouth/Throat:     Comments: Oral mucosa pink and moist, no tonsillar enlargement or exudate. Posterior pharynx patent and nonerythematous, no uvula deviation or swelling. Normal phonation.  Eyes:     Conjunctiva/sclera: Conjunctivae normal.  Cardiovascular:     Rate and Rhythm: Normal rate and regular rhythm.  Pulmonary:     Effort: Pulmonary effort is normal. No respiratory distress.      Comments: Breathing comfortably at rest, CTABL, no wheezing, rales or other adventitious sounds auscultated  Abdominal:     General: There is no distension.  Musculoskeletal:  General: Normal range of motion.     Cervical back: Neck supple.  Skin:    General: Skin is warm and dry.  Neurological:     Mental Status: She is alert and oriented to person, place, and time.     UC Treatments / Results  Labs (all labs ordered are listed, but only abnormal results are displayed) Labs Reviewed - No data to display  EKG   Radiology No results found.  Procedures Procedures (including critical care time)  Medications Ordered in UC Medications - No data to display  Initial Impression / Assessment and Plan / UC Course  I have reviewed the triage vital signs and the nursing notes.  Pertinent labs & imaging results that were available during my care of the patient were reviewed by me and considered in my medical decision making (see chart for details).     Sinusitis-URI symptoms times months with recent worsening, treating with doxycycline, has allergy to penicillins, prednisone course x5 days as well to further help with underlying inflammation/pressure.  Discussed strict return precautions. Patient verbalized understanding and is agreeable with plan.  Final Clinical Impressions(s) / UC Diagnoses   Final diagnoses:  Acute sinusitis with symptoms > 10 days     Discharge Instructions      Begin doxycycline twice daily x1 week Prednisone 40 mg daily x5 days-take with food and earlier in the day if possible Continue Allegra, may supplement with nasal spray or Mucinex Rest and fluids Follow-up if not improving or worsening     ED Prescriptions     Medication Sig Dispense Auth. Provider   doxycycline (VIBRAMYCIN) 100 MG capsule Take 1 capsule (100 mg total) by mouth 2 (two) times daily for 7 days. 20 capsule Burkley Dech C, PA-C   predniSONE (DELTASONE) 20 MG  tablet Take 2 tablets (40 mg total) by mouth daily with breakfast for 5 days. 10 tablet Beautifull [place]isar, [place] C, PA-C      PDMP not reviewed this encounter.   Janith Lima, PA-C 03/19/21 1147

## 2021-03-19 NOTE — ED Triage Notes (Signed)
Pt presents with intermittent sinus headache & congestion along with some dizziness.

## 2021-03-25 DIAGNOSIS — Z961 Presence of intraocular lens: Secondary | ICD-10-CM | POA: Diagnosis not present

## 2021-11-02 ENCOUNTER — Other Ambulatory Visit: Payer: Self-pay | Admitting: Emergency Medicine

## 2021-11-02 DIAGNOSIS — K2100 Gastro-esophageal reflux disease with esophagitis, without bleeding: Secondary | ICD-10-CM

## 2021-11-09 DIAGNOSIS — Z1231 Encounter for screening mammogram for malignant neoplasm of breast: Secondary | ICD-10-CM | POA: Diagnosis not present

## 2021-11-09 DIAGNOSIS — Z01419 Encounter for gynecological examination (general) (routine) without abnormal findings: Secondary | ICD-10-CM | POA: Diagnosis not present

## 2021-11-09 DIAGNOSIS — Z6825 Body mass index (BMI) 25.0-25.9, adult: Secondary | ICD-10-CM | POA: Diagnosis not present

## 2021-11-11 ENCOUNTER — Other Ambulatory Visit: Payer: Self-pay | Admitting: Obstetrics and Gynecology

## 2021-11-11 DIAGNOSIS — R928 Other abnormal and inconclusive findings on diagnostic imaging of breast: Secondary | ICD-10-CM

## 2021-11-14 NOTE — Progress Notes (Unsigned)
Office Visit Note  Patient: Tina Bray             Date of Birth: 1951/08/05           MRN: [id number]             PCP: Horald Pollen, MD Referring: Horald Pollen, * Visit Date: 11/24/2021 Occupation: '@GUAROCC'$ @  Subjective:  No chief complaint on file.   History of Present Illness: Tina Shiley Scales Paulita Bray is a 71 y.o. female returns after her last visit on December 04, 2019.  At that time she presented with pain in multiple joints.  Clinical findings were consistent with osteoarthritis.***   Activities of Daily Living:  Patient reports morning stiffness for *** {minute/hour:19697}.   Patient {ACTIONS;DENIES/REPORTS:21021675::"Denies"} nocturnal pain.  Difficulty dressing/grooming: {ACTIONS;DENIES/REPORTS:21021675::"Denies"} Difficulty climbing stairs: {ACTIONS;DENIES/REPORTS:21021675::"Denies"} Difficulty getting out of chair: {ACTIONS;DENIES/REPORTS:21021675::"Denies"} Difficulty using hands for taps, buttons, cutlery, and/or writing: {ACTIONS;DENIES/REPORTS:21021675::"Denies"}  No Rheumatology ROS completed.   PMFS History:  Patient Active Problem List   Diagnosis Date Noted   Diverticulosis 11/18/2020   Arthritis of right shoulder region 11/18/2020   Dysthymia 11/18/2020   Panic attack 11/18/2020   IBS (irritable bowel syndrome) 10/26/2015   Anxiety 11/14/2011   GERD (gastroesophageal reflux disease) 11/14/2011    Past Medical History:  Diagnosis Date   Allergy    Anxiety    Asthma    Depression    GERD (gastroesophageal reflux disease)    IBS (irritable bowel syndrome)    Kidney stones    Meningitis    Mitral valve prolapse    Osteopenia    Pneumonia     Family History  Problem Relation Age of Onset   Heart disease Mother 44       cardiac stenting/CAD   Hypertension Mother    Lung cancer Mother    Diabetes Mother    Cancer Mother    Stroke Father    Alcohol abuse Father    Arthritis Brother    Anxiety disorder Daughter     Past Surgical History:  Procedure Laterality Date   APPENDECTOMY     CARPAL TUNNEL RELEASE     right and left wrist, right x 3   CHOLECYSTECTOMY     COLONOSCOPY  09/05/1998   normal; repeat in ten years.   COSMETIC SURGERY     eyelids   KNEE ARTHROSCOPY Left    orthoscopic shoulder     right shoulder   SPINE SURGERY     cervical spine C5-6   TONSILLECTOMY     TUBAL LIGATION     Social History   Social History Narrative   Marital status:  Married x 14 years; happily married.  No abuse.  Moved from [place].      Children:  2 daughters; 1 grandson.      Lives:  With husband.      Employment:  Sales promotion account executive.        Tobacco: none      Alcohol: 4 glasses of wine weekly.      Drugs:none      Exercise: yes; walking and running   Immunization History  Administered Date(s) Administered   Fluad Quad(high Dose 65+) 06/23/2019, 06/17/2020   Influenza Inj Mdck Quad With Preservative 06/16/2018   Influenza,inj,Quad PF,6+ Mos 06/04/2015, 07/08/2016   Influenza-Unspecified 06/24/2019   PFIZER(Purple Top)SARS-COV-2 Vaccination 10/27/2019, 11/20/2019   Pneumococcal Conjugate-13 11/15/2017   Pneumococcal Polysaccharide-23 01/31/2019   Tdap 12/11/2013     Objective: Vital Signs: There  were no vitals taken for this visit.   Physical Exam   Musculoskeletal Exam: ***  CDAI Exam: CDAI Score: -- Patient Global: --; Provider Global: -- Swollen: --; Tender: -- Joint Exam 11/24/2021   No joint exam has been documented for this visit   There is currently no information documented on the homunculus. Go to the Rheumatology activity and complete the homunculus joint exam.  Investigation: No additional findings.  Imaging: No results found.  Recent Labs: Lab Results  Component Value Date   WBC 3.3 (L) 11/15/2017   HGB 14.3 11/15/2017   PLT 220 11/15/2017   NA 141 01/01/2020   K 4.2 01/01/2020   CL 104 01/01/2020   CO2 24 01/01/2020   GLUCOSE 82 01/01/2020    BUN 16 01/01/2020   CREATININE 0.80 01/01/2020   BILITOT 0.5 01/01/2020   ALKPHOS 84 01/01/2020   AST 25 01/01/2020   ALT 22 01/01/2020   PROT 6.5 01/01/2020   ALBUMIN 4.4 01/01/2020   CALCIUM 9.9 01/01/2020   GFRAA 86 01/01/2020    11/15/17: RF<10, CCP 3, TSH 2.06   Speciality Comments: No specialty comments available.  Procedures:  No procedures performed Allergies: Erythromycin, Demerol, Penicillins, and Sulfa antibiotics   Assessment / Plan:     Visit Diagnoses: No diagnosis found.  Orders: No orders of the defined types were placed in this encounter.  No orders of the defined types were placed in this encounter.   Face-to-face time spent with patient was *** minutes. Greater than 50% of time was spent in counseling and coordination of care.  Follow-Up Instructions: No follow-ups on file.   Bo Merino, MD  Note - This record has been created using Editor, commissioning.  Chart creation errors have been sought, but may not always  have been located. Such creation errors do not reflect on  the standard of medical care.

## 2021-11-22 DIAGNOSIS — Z0100 Encounter for examination of eyes and vision without abnormal findings: Secondary | ICD-10-CM | POA: Diagnosis not present

## 2021-11-22 DIAGNOSIS — H43813 Vitreous degeneration, bilateral: Secondary | ICD-10-CM | POA: Diagnosis not present

## 2021-11-24 ENCOUNTER — Ambulatory Visit (INDEPENDENT_AMBULATORY_CARE_PROVIDER_SITE_OTHER): Payer: Medicare HMO

## 2021-11-24 ENCOUNTER — Encounter: Payer: Self-pay | Admitting: Rheumatology

## 2021-11-24 ENCOUNTER — Other Ambulatory Visit: Payer: Self-pay

## 2021-11-24 ENCOUNTER — Ambulatory Visit: Payer: Medicare HMO | Admitting: Rheumatology

## 2021-11-24 VITALS — BP 135/84 | HR 66 | Ht 62.0 in | Wt 141.0 lb

## 2021-11-24 DIAGNOSIS — M7061 Trochanteric bursitis, right hip: Secondary | ICD-10-CM

## 2021-11-24 DIAGNOSIS — M5030 Other cervical disc degeneration, unspecified cervical region: Secondary | ICD-10-CM

## 2021-11-24 DIAGNOSIS — Z87442 Personal history of urinary calculi: Secondary | ICD-10-CM

## 2021-11-24 DIAGNOSIS — Z8739 Personal history of other diseases of the musculoskeletal system and connective tissue: Secondary | ICD-10-CM | POA: Diagnosis not present

## 2021-11-24 DIAGNOSIS — Z8719 Personal history of other diseases of the digestive system: Secondary | ICD-10-CM | POA: Diagnosis not present

## 2021-11-24 DIAGNOSIS — G8929 Other chronic pain: Secondary | ICD-10-CM

## 2021-11-24 DIAGNOSIS — M25511 Pain in right shoulder: Secondary | ICD-10-CM

## 2021-11-24 DIAGNOSIS — M19071 Primary osteoarthritis, right ankle and foot: Secondary | ICD-10-CM

## 2021-11-24 DIAGNOSIS — M19041 Primary osteoarthritis, right hand: Secondary | ICD-10-CM | POA: Diagnosis not present

## 2021-11-24 DIAGNOSIS — M19042 Primary osteoarthritis, left hand: Secondary | ICD-10-CM

## 2021-11-24 DIAGNOSIS — Z8709 Personal history of other diseases of the respiratory system: Secondary | ICD-10-CM

## 2021-11-24 DIAGNOSIS — F419 Anxiety disorder, unspecified: Secondary | ICD-10-CM

## 2021-11-24 DIAGNOSIS — M19072 Primary osteoarthritis, left ankle and foot: Secondary | ICD-10-CM

## 2021-11-24 DIAGNOSIS — F32A Depression, unspecified: Secondary | ICD-10-CM

## 2021-11-24 MED ORDER — TRIAMCINOLONE ACETONIDE 40 MG/ML IJ SUSP
40.0000 mg | INTRAMUSCULAR | Status: AC | PRN
  Administered 2021-11-24: 40 mg via INTRA_ARTICULAR

## 2021-11-24 MED ORDER — LIDOCAINE HCL 1 % IJ SOLN
1.5000 mL | INTRAMUSCULAR | Status: AC | PRN
  Administered 2021-11-24: 1.5 mL

## 2021-11-24 NOTE — Patient Instructions (Signed)
Shoulder Exercises ?Ask your health care provider which exercises are safe for you. Do exercises exactly as told by your health care provider and adjust them as directed. It is normal to feel mild stretching, pulling, tightness, or discomfort as you do these exercises. Stop right away if you feel sudden pain or your pain gets worse. Do not begin these exercises until told by your health care provider. ?Stretching exercises ?External rotation and abduction ?This exercise is sometimes called corner stretch. This exercise rotates your arm outward (external rotation) and moves your arm out from your body (abduction). ?Stand in a doorway with one of your feet slightly in front of the other. This is called a staggered stance. If you cannot reach your forearms to the door frame, stand facing a corner of a room. ?Choose one of the following positions as told by your health care provider: ?Place your hands and forearms on the door frame above your head. ?Place your hands and forearms on the door frame at the height of your head. ?Place your hands on the door frame at the height of your elbows. ?Slowly move your weight onto your front foot until you feel a stretch across your chest and in the front of your shoulders. Keep your head and chest upright and keep your abdominal muscles tight. ?Hold for __________ seconds. ?To release the stretch, shift your weight to your back foot. ?Repeat __________ times. Complete this exercise __________ times a day. ?Extension, standing ?Stand and hold a broomstick, a cane, or a similar object behind your back. ?Your hands should be a little wider than shoulder width apart. ?Your palms should face away from your back. ?Keeping your elbows straight and your shoulder muscles relaxed, move the stick away from your body until you feel a stretch in your shoulders (extension). ?Avoid shrugging your shoulders while you move the stick. Keep your shoulder blades tucked down toward the middle of your  back. ?Hold for __________ seconds. ?Slowly return to the starting position. ?Repeat __________ times. Complete this exercise __________ times a day. ?Range-of-motion exercises ?Pendulum ? ?Stand near a wall or a surface that you can hold onto for balance. ?Bend at the waist and let your left / right arm hang straight down. Use your other arm to support you. Keep your back straight and do not lock your knees. ?Relax your left / right arm and shoulder muscles, and move your hips and your trunk so your left / right arm swings freely. Your arm should swing because of the motion of your body, not because you are using your arm or shoulder muscles. ?Keep moving your hips and trunk so your arm swings in the following directions, as told by your health care provider: ?Side to side. ?Forward and backward. ?In clockwise and counterclockwise circles. ?Continue each motion for __________ seconds, or for as long as told by your health care provider. ?Slowly return to the starting position. ?Repeat __________ times. Complete this exercise __________ times a day. ?Shoulder flexion, standing ? ?Stand and hold a broomstick, a cane, or a similar object. Place your hands a little more than shoulder width apart on the object. Your left / right hand should be palm up, and your other hand should be palm down. ?Keep your elbow straight and your shoulder muscles relaxed. Push the stick up with your healthy arm to raise your left / right arm in front of your body, and then over your head until you feel a stretch in your shoulder (flexion). ?Avoid   shrugging your shoulder while you raise your arm. Keep your shoulder blade tucked down toward the middle of your back. ?Hold for __________ seconds. ?Slowly return to the starting position. ?Repeat __________ times. Complete this exercise __________ times a day. ?Shoulder abduction, standing ?Stand and hold a broomstick, a cane, or a similar object. Place your hands a little more than shoulder  width apart on the object. Your left / right hand should be palm up, and your other hand should be palm down. ?Keep your elbow straight and your shoulder muscles relaxed. Push the object across your body toward your left / right side. Raise your left / right arm to the side of your body (abduction) until you feel a stretch in your shoulder. ?Do not raise your arm above shoulder height unless your health care provider tells you to do that. ?If directed, raise your arm over your head. ?Avoid shrugging your shoulder while you raise your arm. Keep your shoulder blade tucked down toward the middle of your back. ?Hold for __________ seconds. ?Slowly return to the starting position. ?Repeat __________ times. Complete this exercise __________ times a day. ?Internal rotation ? ?Place your left / right hand behind your back, palm up. ?Use your other hand to dangle an exercise band, a towel, or a similar object over your shoulder. Grasp the band with your left / right hand so you are holding on to both ends. ?Gently pull up on the band until you feel a stretch in the front of your left / right shoulder. The movement of your arm toward the center of your body is called internal rotation. ?Avoid shrugging your shoulder while you raise your arm. Keep your shoulder blade tucked down toward the middle of your back. ?Hold for __________ seconds. ?Release the stretch by letting go of the band and lowering your hands. ?Repeat __________ times. Complete this exercise __________ times a day. ?Strengthening exercises ?External rotation ? ?Sit in a stable chair without armrests. ?Secure an exercise band to a stable object at elbow height on your left / right side. ?Place a soft object, such as a folded towel or a small pillow, between your left / right upper arm and your body to move your elbow about 4 inches (10 cm) away from your side. ?Hold the end of the exercise band so it is tight and there is no slack. ?Keeping your elbow pressed  against the soft object, slowly move your forearm out, away from your abdomen (external rotation). Keep your body steady so only your forearm moves. ?Hold for __________ seconds. ?Slowly return to the starting position. ?Repeat __________ times. Complete this exercise __________ times a day. ?Shoulder abduction ? ?Sit in a stable chair without armrests, or stand up. ?Hold a __________ weight in your left / right hand, or hold an exercise band with both hands. ?Start with your arms straight down and your left / right palm facing in, toward your body. ?Slowly lift your left / right hand out to your side (abduction). Do not lift your hand above shoulder height unless your health care provider tells you that this is safe. ?Keep your arms straight. ?Avoid shrugging your shoulder while you do this movement. Keep your shoulder blade tucked down toward the middle of your back. ?Hold for __________ seconds. ?Slowly lower your arm, and return to the starting position. ?Repeat __________ times. Complete this exercise __________ times a day. ?Shoulder extension ?Sit in a stable chair without armrests, or stand up. ?Secure an exercise band   to a stable object in front of you so it is at shoulder height. ?Hold one end of the exercise band in each hand. Your palms should face each other. ?Straighten your elbows and lift your hands up to shoulder height. ?Step back, away from the secured end of the exercise band, until the band is tight and there is no slack. ?Squeeze your shoulder blades together as you pull your hands down to the sides of your thighs (extension). Stop when your hands are straight down by your sides. Do not let your hands go behind your body. ?Hold for __________ seconds. ?Slowly return to the starting position. ?Repeat __________ times. Complete this exercise __________ times a day. ?Shoulder row ?Sit in a stable chair without armrests, or stand up. ?Secure an exercise band to a stable object in front of you so it  is at waist height. ?Hold one end of the exercise band in each hand. Position your palms so that your thumbs are facing the ceiling (neutral position). ?Bend each of your elbows to a 90-degree angle (righ

## 2021-11-25 DIAGNOSIS — N950 Postmenopausal bleeding: Secondary | ICD-10-CM | POA: Diagnosis not present

## 2021-12-06 ENCOUNTER — Ambulatory Visit
Admission: RE | Admit: 2021-12-06 | Discharge: 2021-12-06 | Disposition: A | Discharge status: Home / Self Care | Payer: Medicare HMO | Source: Ambulatory Visit | Attending: Obstetrics and Gynecology | Admitting: Obstetrics and Gynecology

## 2021-12-06 ENCOUNTER — Ambulatory Visit: Payer: Medicare HMO

## 2021-12-06 DIAGNOSIS — R922 Inconclusive mammogram: Secondary | ICD-10-CM | POA: Diagnosis not present

## 2021-12-06 DIAGNOSIS — R928 Other abnormal and inconclusive findings on diagnostic imaging of breast: Secondary | ICD-10-CM

## 2021-12-15 DIAGNOSIS — Z79811 Long term (current) use of aromatase inhibitors: Secondary | ICD-10-CM | POA: Diagnosis not present

## 2021-12-15 DIAGNOSIS — F341 Dysthymic disorder: Secondary | ICD-10-CM | POA: Diagnosis not present

## 2021-12-15 DIAGNOSIS — F419 Anxiety disorder, unspecified: Secondary | ICD-10-CM | POA: Diagnosis not present

## 2021-12-15 DIAGNOSIS — Z79899 Other long term (current) drug therapy: Secondary | ICD-10-CM | POA: Diagnosis not present

## 2021-12-15 DIAGNOSIS — K219 Gastro-esophageal reflux disease without esophagitis: Secondary | ICD-10-CM | POA: Diagnosis not present

## 2021-12-15 DIAGNOSIS — R69 Illness, unspecified: Secondary | ICD-10-CM | POA: Diagnosis not present

## 2021-12-15 DIAGNOSIS — G479 Sleep disorder, unspecified: Secondary | ICD-10-CM | POA: Diagnosis not present

## 2021-12-27 DIAGNOSIS — H40013 Open angle with borderline findings, low risk, bilateral: Secondary | ICD-10-CM | POA: Diagnosis not present

## 2022-02-02 DIAGNOSIS — H3589 Other specified retinal disorders: Secondary | ICD-10-CM | POA: Diagnosis not present

## 2022-02-02 DIAGNOSIS — H40013 Open angle with borderline findings, low risk, bilateral: Secondary | ICD-10-CM | POA: Diagnosis not present

## 2022-02-20 DIAGNOSIS — R030 Elevated blood-pressure reading, without diagnosis of hypertension: Secondary | ICD-10-CM | POA: Diagnosis not present

## 2022-02-20 DIAGNOSIS — K219 Gastro-esophageal reflux disease without esophagitis: Secondary | ICD-10-CM | POA: Diagnosis not present

## 2022-02-20 DIAGNOSIS — R69 Illness, unspecified: Secondary | ICD-10-CM | POA: Diagnosis not present

## 2022-02-20 DIAGNOSIS — J302 Other seasonal allergic rhinitis: Secondary | ICD-10-CM | POA: Diagnosis not present

## 2022-02-20 DIAGNOSIS — H60502 Unspecified acute noninfective otitis externa, left ear: Secondary | ICD-10-CM | POA: Diagnosis not present

## 2022-02-21 ENCOUNTER — Other Ambulatory Visit: Payer: Self-pay | Admitting: Obstetrics and Gynecology

## 2022-02-21 DIAGNOSIS — N840 Polyp of corpus uteri: Secondary | ICD-10-CM | POA: Diagnosis not present

## 2022-02-21 DIAGNOSIS — N950 Postmenopausal bleeding: Secondary | ICD-10-CM | POA: Diagnosis not present

## 2022-02-21 DIAGNOSIS — N858 Other specified noninflammatory disorders of uterus: Secondary | ICD-10-CM | POA: Diagnosis not present

## 2022-03-09 DIAGNOSIS — L820 Inflamed seborrheic keratosis: Secondary | ICD-10-CM | POA: Diagnosis not present

## 2022-03-09 DIAGNOSIS — L718 Other rosacea: Secondary | ICD-10-CM | POA: Diagnosis not present

## 2022-03-09 DIAGNOSIS — D2371 Other benign neoplasm of skin of right lower limb, including hip: Secondary | ICD-10-CM | POA: Diagnosis not present

## 2022-03-09 DIAGNOSIS — L570 Actinic keratosis: Secondary | ICD-10-CM | POA: Diagnosis not present

## 2022-03-09 DIAGNOSIS — D485 Neoplasm of uncertain behavior of skin: Secondary | ICD-10-CM | POA: Diagnosis not present

## 2022-03-09 DIAGNOSIS — Z129 Encounter for screening for malignant neoplasm, site unspecified: Secondary | ICD-10-CM | POA: Diagnosis not present

## 2022-03-09 DIAGNOSIS — D1723 Benign lipomatous neoplasm of skin and subcutaneous tissue of right leg: Secondary | ICD-10-CM | POA: Diagnosis not present

## 2022-03-09 DIAGNOSIS — L300 Nummular dermatitis: Secondary | ICD-10-CM | POA: Diagnosis not present

## 2022-03-09 DIAGNOSIS — L821 Other seborrheic keratosis: Secondary | ICD-10-CM | POA: Diagnosis not present

## 2022-05-13 NOTE — Progress Notes (Deleted)
Office Visit Note  Patient: Tina Bray             Date of Birth: [birth year]           MRN: [id number]             PCP: Horald Pollen, MD Referring: Horald Pollen, * Visit Date: 05/27/2022 Occupation: '@GUAROCC'$ @  Subjective:  No chief complaint on file.   History of Present Illness: Tina Bray is a 71 y.o. female ***   Activities of Daily Living:  Patient reports morning stiffness for *** {minute/hour:19697}.   Patient {ACTIONS;DENIES/REPORTS:21021675::"Denies"} nocturnal pain.  Difficulty dressing/grooming: {ACTIONS;DENIES/REPORTS:21021675::"Denies"} Difficulty climbing stairs: {ACTIONS;DENIES/REPORTS:21021675::"Denies"} Difficulty getting out of chair: {ACTIONS;DENIES/REPORTS:21021675::"Denies"} Difficulty using hands for taps, buttons, cutlery, and/or writing: {ACTIONS;DENIES/REPORTS:21021675::"Denies"}  No Rheumatology ROS completed.   PMFS History:  Patient Active Problem List   Diagnosis Date Noted   Diverticulosis 11/18/2020   Arthritis of right shoulder region 11/18/2020   Dysthymia 11/18/2020   Panic attack 11/18/2020   IBS (irritable bowel syndrome) 10/26/2015   Anxiety 11/14/2011   GERD (gastroesophageal reflux disease) 11/14/2011    Past Medical History:  Diagnosis Date   Allergy    Anxiety    Asthma    Depression    GERD (gastroesophageal reflux disease)    IBS (irritable bowel syndrome)    Kidney stones    Meningitis    Mitral valve prolapse    Osteopenia    Pneumonia     Family History  Problem Relation Age of Onset   Heart disease Mother 1       cardiac stenting/CAD   Hypertension Mother    Lung cancer Mother    Diabetes Mother    Cancer Mother    Stroke Father    Alcohol abuse Father    Arthritis Brother    Anxiety disorder Daughter    Past Surgical History:  Procedure Laterality Date   APPENDECTOMY     CARPAL TUNNEL RELEASE     right and left wrist, right x 3   CHOLECYSTECTOMY      COLONOSCOPY  09/05/1998   normal; repeat in ten years.   COSMETIC SURGERY     eyelids   KNEE ARTHROSCOPY Left    orthoscopic shoulder     right shoulder   SPINE SURGERY     cervical spine C5-6   TONSILLECTOMY     TUBAL LIGATION     Social History   Social History Narrative   Marital status:  Married x 14 years; happily married.  No abuse.  Moved from [place].      Children:  2 daughters; 1 grandson.      Lives:  With husband.      Employment:  Sales promotion account executive.        Tobacco: none      Alcohol: 4 glasses of wine weekly.      Drugs:none      Exercise: yes; walking and running   Immunization History  Administered Date(s) Administered   Fluad Quad(high Dose 65+) 06/23/2019, 06/17/2020   Influenza Inj Mdck Quad With Preservative 06/16/2018   Influenza,inj,Quad PF,6+ Mos 06/04/2015, 07/08/2016   Influenza-Unspecified 06/24/2019   PFIZER Comirnaty(Gray Top)Covid-19 Tri-Sucrose Vaccine 10/27/2019, 11/20/2019   PFIZER(Purple Top)SARS-COV-2 Vaccination 10/27/2019, 11/20/2019   Pneumococcal Conjugate-13 11/15/2017   Pneumococcal Polysaccharide-23 01/31/2019   Tdap 12/11/2013     Objective: Vital Signs: There were no vitals taken for this visit.   Physical Exam   Musculoskeletal Exam: ***  CDAI Exam: CDAI Score: -- Patient Global: --; Provider Global: -- Swollen: --; Tender: -- Joint Exam 05/27/2022   No joint exam has been documented for this visit   There is currently no information documented on the homunculus. Go to the Rheumatology activity and complete the homunculus joint exam.  Investigation: No additional findings.  Imaging: No results found.  Recent Labs: Lab Results  Component Value Date   WBC 3.3 (L) 11/15/2017   HGB 14.3 11/15/2017   PLT 220 11/15/2017   NA 141 01/01/2020   K 4.2 01/01/2020   CL 104 01/01/2020   CO2 24 01/01/2020   GLUCOSE 82 01/01/2020   BUN 16 01/01/2020   CREATININE 0.80 01/01/2020   BILITOT 0.5 01/01/2020    ALKPHOS 84 01/01/2020   AST 25 01/01/2020   ALT 22 01/01/2020   PROT 6.5 01/01/2020   ALBUMIN 4.4 01/01/2020   CALCIUM 9.9 01/01/2020   GFRAA 86 01/01/2020    Speciality Comments: No specialty comments available.  Procedures:  No procedures performed Allergies: Erythromycin, Demerol, Penicillins, and Sulfa antibiotics   Assessment / Plan:     Visit Diagnoses: Primary osteoarthritis of both hands  Chronic right shoulder pain  Trochanteric bursitis, right hip  Primary osteoarthritis of both feet  DDD (degenerative disc disease), cervical  History of osteopenia  History of IBS  History of gastroesophageal reflux (GERD)  History of asthma  History of kidney stones  Anxiety and depression  Orders: No orders of the defined types were placed in this encounter.  No orders of the defined types were placed in this encounter.   Face-to-face time spent with patient was *** minutes. Greater than 50% of time was spent in counseling and coordination of care.  Follow-Up Instructions: No follow-ups on file.   Ofilia Neas, PA-C  Note - This record has been created using Dragon software.  Chart creation errors have been sought, but may not always  have been located. Such creation errors do not reflect on  the standard of medical care.

## 2022-05-27 ENCOUNTER — Ambulatory Visit: Payer: Medicare HMO | Admitting: Rheumatology

## 2022-05-27 DIAGNOSIS — Z8709 Personal history of other diseases of the respiratory system: Secondary | ICD-10-CM

## 2022-05-27 DIAGNOSIS — F32A Depression, unspecified: Secondary | ICD-10-CM

## 2022-05-27 DIAGNOSIS — Z8719 Personal history of other diseases of the digestive system: Secondary | ICD-10-CM

## 2022-05-27 DIAGNOSIS — M5030 Other cervical disc degeneration, unspecified cervical region: Secondary | ICD-10-CM

## 2022-05-27 DIAGNOSIS — Z87442 Personal history of urinary calculi: Secondary | ICD-10-CM

## 2022-05-27 DIAGNOSIS — M7061 Trochanteric bursitis, right hip: Secondary | ICD-10-CM

## 2022-05-27 DIAGNOSIS — G8929 Other chronic pain: Secondary | ICD-10-CM

## 2022-05-27 DIAGNOSIS — Z8739 Personal history of other diseases of the musculoskeletal system and connective tissue: Secondary | ICD-10-CM

## 2022-05-27 DIAGNOSIS — M19041 Primary osteoarthritis, right hand: Secondary | ICD-10-CM

## 2022-05-27 DIAGNOSIS — M19071 Primary osteoarthritis, right ankle and foot: Secondary | ICD-10-CM

## 2022-08-11 DIAGNOSIS — I739 Peripheral vascular disease, unspecified: Secondary | ICD-10-CM | POA: Diagnosis not present

## 2022-08-11 DIAGNOSIS — Z9181 History of falling: Secondary | ICD-10-CM | POA: Diagnosis not present

## 2022-08-11 DIAGNOSIS — R69 Illness, unspecified: Secondary | ICD-10-CM | POA: Diagnosis not present

## 2022-08-11 DIAGNOSIS — M1990 Unspecified osteoarthritis, unspecified site: Secondary | ICD-10-CM | POA: Diagnosis not present

## 2022-08-11 DIAGNOSIS — K219 Gastro-esophageal reflux disease without esophagitis: Secondary | ICD-10-CM | POA: Diagnosis not present

## 2022-08-11 DIAGNOSIS — Z880 Allergy status to penicillin: Secondary | ICD-10-CM | POA: Diagnosis not present

## 2022-08-11 DIAGNOSIS — Z791 Long term (current) use of non-steroidal anti-inflammatories (NSAID): Secondary | ICD-10-CM | POA: Diagnosis not present

## 2022-08-11 DIAGNOSIS — Z008 Encounter for other general examination: Secondary | ICD-10-CM | POA: Diagnosis not present

## 2022-08-11 DIAGNOSIS — R030 Elevated blood-pressure reading, without diagnosis of hypertension: Secondary | ICD-10-CM | POA: Diagnosis not present

## 2022-08-11 DIAGNOSIS — Z882 Allergy status to sulfonamides status: Secondary | ICD-10-CM | POA: Diagnosis not present

## 2022-08-11 DIAGNOSIS — Z823 Family history of stroke: Secondary | ICD-10-CM | POA: Diagnosis not present

## 2022-08-19 DIAGNOSIS — R0989 Other specified symptoms and signs involving the circulatory and respiratory systems: Secondary | ICD-10-CM | POA: Diagnosis not present

## 2022-08-20 DIAGNOSIS — R0989 Other specified symptoms and signs involving the circulatory and respiratory systems: Secondary | ICD-10-CM | POA: Diagnosis not present

## 2022-08-20 DIAGNOSIS — Z79899 Other long term (current) drug therapy: Secondary | ICD-10-CM | POA: Diagnosis not present

## 2022-08-30 DIAGNOSIS — I739 Peripheral vascular disease, unspecified: Secondary | ICD-10-CM | POA: Diagnosis not present

## 2022-09-16 DIAGNOSIS — I739 Peripheral vascular disease, unspecified: Secondary | ICD-10-CM | POA: Insufficient documentation

## 2022-11-28 DIAGNOSIS — N952 Postmenopausal atrophic vaginitis: Secondary | ICD-10-CM | POA: Diagnosis not present

## 2022-11-28 DIAGNOSIS — Z6825 Body mass index (BMI) 25.0-25.9, adult: Secondary | ICD-10-CM | POA: Diagnosis not present

## 2022-11-28 DIAGNOSIS — Z01419 Encounter for gynecological examination (general) (routine) without abnormal findings: Secondary | ICD-10-CM | POA: Diagnosis not present

## 2022-12-14 DIAGNOSIS — Z0000 Encounter for general adult medical examination without abnormal findings: Secondary | ICD-10-CM | POA: Diagnosis not present

## 2022-12-14 DIAGNOSIS — M8580 Other specified disorders of bone density and structure, unspecified site: Secondary | ICD-10-CM | POA: Diagnosis not present

## 2022-12-14 DIAGNOSIS — K219 Gastro-esophageal reflux disease without esophagitis: Secondary | ICD-10-CM | POA: Diagnosis not present

## 2022-12-16 DIAGNOSIS — Z780 Asymptomatic menopausal state: Secondary | ICD-10-CM | POA: Diagnosis not present

## 2022-12-16 DIAGNOSIS — R69 Illness, unspecified: Secondary | ICD-10-CM | POA: Diagnosis not present

## 2022-12-16 DIAGNOSIS — I739 Peripheral vascular disease, unspecified: Secondary | ICD-10-CM | POA: Diagnosis not present

## 2022-12-16 DIAGNOSIS — Z0000 Encounter for general adult medical examination without abnormal findings: Secondary | ICD-10-CM | POA: Diagnosis not present

## 2022-12-16 DIAGNOSIS — K219 Gastro-esophageal reflux disease without esophagitis: Secondary | ICD-10-CM | POA: Diagnosis not present

## 2022-12-16 DIAGNOSIS — Z9109 Other allergy status, other than to drugs and biological substances: Secondary | ICD-10-CM | POA: Diagnosis not present

## 2022-12-16 DIAGNOSIS — E782 Mixed hyperlipidemia: Secondary | ICD-10-CM | POA: Diagnosis not present

## 2022-12-16 DIAGNOSIS — D229 Melanocytic nevi, unspecified: Secondary | ICD-10-CM | POA: Diagnosis not present

## 2022-12-16 DIAGNOSIS — M8580 Other specified disorders of bone density and structure, unspecified site: Secondary | ICD-10-CM | POA: Diagnosis not present

## 2022-12-27 NOTE — Progress Notes (Deleted)
Office Visit Note  Patient: Tina Bray             Date of Birth: 03-May-1951           MRN: [id number]             PCP: Georgina Quint, MD Referring: Georgina Quint, * Visit Date: 01/10/2023 Occupation: @  Subjective:  No chief complaint on file.   History of Present Illness: Tina Bray is a 72 y.o. female ***     Activities of Daily Living:  Patient reports morning stiffness for *** {minute/hour:19697}.   Patient {ACTIONS;DENIES/REPORTS:21021675::"Denies"} nocturnal pain.  Difficulty dressing/grooming: {ACTIONS;DENIES/REPORTS:21021675::"Denies"} Difficulty climbing stairs: {ACTIONS;DENIES/REPORTS:21021675::"Denies"} Difficulty getting out of chair: {ACTIONS;DENIES/REPORTS:21021675::"Denies"} Difficulty using hands for taps, buttons, cutlery, and/or writing: {ACTIONS;DENIES/REPORTS:21021675::"Denies"}  No Rheumatology ROS completed.   PMFS History:  Patient Active Problem List   Diagnosis Date Noted   Diverticulosis 11/18/2020   Arthritis of right shoulder region 11/18/2020   Dysthymia 11/18/2020   Panic attack 11/18/2020   IBS (irritable bowel syndrome) 10/26/2015   Anxiety 11/14/2011   GERD (gastroesophageal reflux disease) 11/14/2011    Past Medical History:  Diagnosis Date   Allergy    Anxiety    Asthma    Depression    GERD (gastroesophageal reflux disease)    IBS (irritable bowel syndrome)    Kidney stones    Meningitis    Mitral valve prolapse    Osteopenia    Pneumonia     Family History  Problem Relation Age of Onset   Heart disease Mother 41       cardiac stenting/CAD   Hypertension Mother    Lung cancer Mother    Diabetes Mother    Cancer Mother    Stroke Father    Alcohol abuse Father    Arthritis Brother    Anxiety disorder Daughter    Past Surgical History:  Procedure Laterality Date   APPENDECTOMY     CARPAL TUNNEL RELEASE     right and left wrist, right x 3   CHOLECYSTECTOMY      COLONOSCOPY  09/05/1998   normal; repeat in ten years.   COSMETIC SURGERY     eyelids   KNEE ARTHROSCOPY Left    orthoscopic shoulder     right shoulder   SPINE SURGERY     cervical spine C5-6   TONSILLECTOMY     TUBAL LIGATION     Social History   Social History Narrative   Marital status:  Married x 14 years; happily married.  No abuse.  Moved from [place].      Children:  2 daughters; 1 grandson.      Lives:  With husband.      Employment:  Surveyor, minerals.        Tobacco: none      Alcohol: 4 glasses of wine weekly.      Drugs:none      Exercise: yes; walking and running   Immunization History  Administered Date(s) Administered   Fluad Quad(high Dose 65+) 06/23/2019, 06/17/2020   Influenza Inj Mdck Quad With Preservative 06/16/2018   Influenza,inj,Quad PF,6+ Mos 06/04/2015, 07/08/2016   Influenza-Unspecified 06/24/2019   PFIZER Comirnaty(Gray Top)Covid-19 Tri-Sucrose Vaccine 10/27/2019, 11/20/2019, 01/02/2021   PFIZER(Purple Top)SARS-COV-2 Vaccination 10/27/2019, 11/20/2019   Pfizer Covid-19 Vaccine Bivalent Booster 22yrs & up 07/03/2021   Pneumococcal Conjugate-13 11/15/2017   Pneumococcal Polysaccharide-23 01/31/2019   Tdap 12/11/2013     Objective: Vital Signs: There were no vitals  taken for this visit.   Physical Exam   Musculoskeletal Exam: ***  CDAI Exam: CDAI Score: -- Patient Global: --; Provider Global: -- Swollen: --; Tender: -- Joint Exam 01/10/2023   No joint exam has been documented for this visit   There is currently no information documented on the homunculus. Go to the Rheumatology activity and complete the homunculus joint exam.  Investigation: No additional findings.  Imaging: No results found.  Recent Labs: Lab Results  Component Value Date   WBC 3.3 (L) 11/15/2017   HGB 14.3 11/15/2017   PLT 220 11/15/2017   NA 141 01/01/2020   K 4.2 01/01/2020   CL 104 01/01/2020   CO2 24 01/01/2020   GLUCOSE 82 01/01/2020    BUN 16 01/01/2020   CREATININE 0.80 01/01/2020   BILITOT 0.5 01/01/2020   ALKPHOS 84 01/01/2020   AST 25 01/01/2020   ALT 22 01/01/2020   PROT 6.5 01/01/2020   ALBUMIN 4.4 01/01/2020   CALCIUM 9.9 01/01/2020   GFRAA 86 01/01/2020    Speciality Comments: No specialty comments available.  Procedures:  No procedures performed Allergies: Erythromycin, Demerol, Penicillins, and Sulfa antibiotics   Assessment / Plan:     Visit Diagnoses: No diagnosis found.  Orders: No orders of the defined types were placed in this encounter.  No orders of the defined types were placed in this encounter.   Face-to-face time spent with patient was *** minutes. Greater than 50% of time was spent in counseling and coordination of care.  Follow-Up Instructions: No follow-ups on file.   Ellen Henri, CMA  Note - This record has been created using Animal nutritionist.  Chart creation errors have been sought, but may not always  have been located. Such creation errors do not reflect on  the standard of medical care.

## 2023-01-03 ENCOUNTER — Encounter: Payer: Self-pay | Admitting: Podiatry

## 2023-01-03 ENCOUNTER — Ambulatory Visit: Payer: Medicare HMO | Admitting: Podiatry

## 2023-01-03 DIAGNOSIS — D2371 Other benign neoplasm of skin of right lower limb, including hip: Secondary | ICD-10-CM | POA: Diagnosis not present

## 2023-01-03 DIAGNOSIS — D2372 Other benign neoplasm of skin of left lower limb, including hip: Secondary | ICD-10-CM

## 2023-01-03 DIAGNOSIS — M7752 Other enthesopathy of left foot: Secondary | ICD-10-CM | POA: Diagnosis not present

## 2023-01-03 DIAGNOSIS — M7751 Other enthesopathy of right foot: Secondary | ICD-10-CM | POA: Diagnosis not present

## 2023-01-03 MED ORDER — DEXAMETHASONE SODIUM PHOSPHATE 120 MG/30ML IJ SOLN
4.0000 mg | Freq: Once | INTRAMUSCULAR | Status: AC
Start: 2023-01-03 — End: 2023-01-03
  Administered 2023-01-03: 4 mg via INTRA_ARTICULAR

## 2023-01-03 NOTE — Progress Notes (Signed)
She presents today chief complaint of a painful area beneath the fifth metatarsal phalangeal joint.  States that the callus is painful.  Is hard to walk.  Objective: Vital signs are stable oriented x 3 plantar aspect of the bilateral foot does demonstrate reactive hyper keratomas with palpable bursa beneath the fifth metatarsal heads bilaterally.  Assessment: Bursitis subfifth metatarsal head bilateral with benign skin lesions overlying that.  Plan: I injected his areas today with dexamethasone and local anesthetic I also debrided benign skin lesions we will follow-up with her on an as-needed basis.

## 2023-01-06 DIAGNOSIS — H40003 Preglaucoma, unspecified, bilateral: Secondary | ICD-10-CM | POA: Diagnosis not present

## 2023-01-10 ENCOUNTER — Ambulatory Visit: Payer: Medicare HMO | Admitting: Rheumatology

## 2023-01-10 DIAGNOSIS — M7061 Trochanteric bursitis, right hip: Secondary | ICD-10-CM

## 2023-01-10 DIAGNOSIS — M5030 Other cervical disc degeneration, unspecified cervical region: Secondary | ICD-10-CM

## 2023-01-10 DIAGNOSIS — M19071 Primary osteoarthritis, right ankle and foot: Secondary | ICD-10-CM

## 2023-01-10 DIAGNOSIS — Z87442 Personal history of urinary calculi: Secondary | ICD-10-CM

## 2023-01-10 DIAGNOSIS — M19041 Primary osteoarthritis, right hand: Secondary | ICD-10-CM

## 2023-01-10 DIAGNOSIS — Z8709 Personal history of other diseases of the respiratory system: Secondary | ICD-10-CM

## 2023-01-10 DIAGNOSIS — Z8719 Personal history of other diseases of the digestive system: Secondary | ICD-10-CM

## 2023-01-10 DIAGNOSIS — F32A Depression, unspecified: Secondary | ICD-10-CM

## 2023-01-10 DIAGNOSIS — G8929 Other chronic pain: Secondary | ICD-10-CM

## 2023-01-10 DIAGNOSIS — Z8739 Personal history of other diseases of the musculoskeletal system and connective tissue: Secondary | ICD-10-CM

## 2023-01-18 NOTE — Progress Notes (Deleted)
Office Visit Note  Patient: Tina Bray             Date of Birth: [birth year]           MRN: [id number]             PCP: Georgina Quint, MD Referring: Georgina Quint, * Visit Date: 02/01/2023 Occupation: @GUAROCC @  Subjective:  No chief complaint on file.   History of Present Illness: Tina Bray Sellar is a 72 y.o. female ***     Activities of Daily Living:  Patient reports morning stiffness for *** {minute/hour:19697}.   Patient {ACTIONS;DENIES/REPORTS:21021675::"Denies"} nocturnal pain.  Difficulty dressing/grooming: {ACTIONS;DENIES/REPORTS:21021675::"Denies"} Difficulty climbing stairs: {ACTIONS;DENIES/REPORTS:21021675::"Denies"} Difficulty getting out of chair: {ACTIONS;DENIES/REPORTS:21021675::"Denies"} Difficulty using hands for taps, buttons, cutlery, and/or writing: {ACTIONS;DENIES/REPORTS:21021675::"Denies"}  No Rheumatology ROS completed.   PMFS History:  Patient Active Problem List   Diagnosis Date Noted   Peripheral arterial disease (HCC) 09/16/2022   Diverticulosis 11/18/2020   Arthritis of right shoulder region 11/18/2020   Dysthymia 11/18/2020   Panic attack 11/18/2020   Atrophic vaginitis 08/12/2020   Osteopenia 08/12/2020   Pain of left hand 07/30/2019   Arthritis of carpometacarpal [facility]) joint of right thumb 04/13/2018   Carpal tunnel syndrome of right wrist 01/22/2018   IBS (irritable bowel syndrome) 10/26/2015   Anxiety 11/14/2011   GERD (gastroesophageal reflux disease) 11/14/2011    Past Medical History:  Diagnosis Date   Allergy    Anxiety    Asthma    Depression    GERD (gastroesophageal reflux disease)    IBS (irritable bowel syndrome)    Kidney stones    Meningitis    Mitral valve prolapse    Osteopenia    Pneumonia     Family History  Problem Relation Age of Onset   Heart disease Mother 60       cardiac stenting/CAD   Hypertension Mother    Lung cancer Mother    Diabetes Mother    Cancer  Mother    Stroke Father    Alcohol abuse Father    Arthritis Brother    Anxiety disorder Daughter    Past Surgical History:  Procedure Laterality Date   APPENDECTOMY     CARPAL TUNNEL RELEASE     right and left wrist, right x 3   CHOLECYSTECTOMY     COLONOSCOPY  09/05/1998   normal; repeat in ten years.   COSMETIC SURGERY     eyelids   KNEE ARTHROSCOPY Left    orthoscopic shoulder     right shoulder   SPINE SURGERY     cervical spine C5-6   TONSILLECTOMY     TUBAL LIGATION     Social History   Social History Narrative   Marital status:  Married x 14 years; happily married.  No abuse.  Moved from [place].      Children:  2 daughters; 1 grandson.      Lives:  With husband.      Employment:  Surveyor, minerals.        Tobacco: none      Alcohol: 4 glasses of wine weekly.      Drugs:none      Exercise: yes; walking and running   Immunization History  Administered Date(s) Administered   Fluad Quad(high Dose 65+) 06/23/2019, 06/17/2020   Influenza Inj Mdck Quad With Preservative 06/16/2018   Influenza,inj,Quad PF,6+ Mos 06/04/2015, 07/08/2016   Influenza-Unspecified 06/16/2018, 06/24/2019, 06/05/2020, 06/08/2022   PFIZER Comirnaty(Gray Top)Covid-19 Tri-Sucrose Vaccine  10/27/2019, 11/20/2019, 01/02/2021   PFIZER(Purple Top)SARS-COV-2 Vaccination 10/27/2019, 11/20/2019   Pfizer Covid-19 Vaccine Bivalent Booster 58yrs & up 07/03/2021   Pneumococcal Conjugate-13 11/15/2017   Pneumococcal Polysaccharide-23 01/31/2019   Pneumococcal-Unspecified 06/14/2020, 06/08/2022   Respiratory Syncytial Virus Vaccine,Recomb Aduvanted(Arexvy) 06/07/2022   Tdap 12/11/2013   Zoster Recombinat (Shingrix) 07/19/2022   Zoster, Live 06/08/2022     Objective: Vital Signs: There were no vitals taken for this visit.   Physical Exam   Musculoskeletal Exam: ***  CDAI Exam: CDAI Score: -- Patient Global: --; Provider Global: -- Swollen: --; Tender: -- Joint Exam 02/01/2023   No  joint exam has been documented for this visit   There is currently no information documented on the homunculus. Go to the Rheumatology activity and complete the homunculus joint exam.  Investigation: No additional findings.  Imaging: No results found.  Recent Labs: Lab Results  Component Value Date   WBC 3.3 (L) 11/15/2017   HGB 14.3 11/15/2017   PLT 220 11/15/2017   NA 141 01/01/2020   K 4.2 01/01/2020   CL 104 01/01/2020   CO2 24 01/01/2020   GLUCOSE 82 01/01/2020   BUN 16 01/01/2020   CREATININE 0.80 01/01/2020   BILITOT 0.5 01/01/2020   ALKPHOS 84 01/01/2020   AST 25 01/01/2020   ALT 22 01/01/2020   PROT 6.5 01/01/2020   ALBUMIN 4.4 01/01/2020   CALCIUM 9.9 01/01/2020   GFRAA 86 01/01/2020    Speciality Comments: No specialty comments available.  Procedures:  No procedures performed Allergies: Erythromycin, Demerol, Penicillamine, Penicillins, and Sulfa antibiotics   Assessment / Plan:     Visit Diagnoses: No diagnosis found.  Orders: No orders of the defined types were placed in this encounter.  No orders of the defined types were placed in this encounter.   Face-to-face time spent with patient was *** minutes. Greater than 50% of time was spent in counseling and coordination of care.  Follow-Up Instructions: No follow-ups on file.   Ellen Henri, CMA  Note - This record has been created using Animal nutritionist.  Chart creation errors have been sought, but may not always  have been located. Such creation errors do not reflect on  the standard of medical care.

## 2023-02-01 ENCOUNTER — Ambulatory Visit: Payer: Medicare HMO | Admitting: Rheumatology

## 2023-02-01 DIAGNOSIS — Z8719 Personal history of other diseases of the digestive system: Secondary | ICD-10-CM

## 2023-02-01 DIAGNOSIS — M19041 Primary osteoarthritis, right hand: Secondary | ICD-10-CM

## 2023-02-01 DIAGNOSIS — G8929 Other chronic pain: Secondary | ICD-10-CM

## 2023-02-01 DIAGNOSIS — Z8739 Personal history of other diseases of the musculoskeletal system and connective tissue: Secondary | ICD-10-CM

## 2023-02-01 DIAGNOSIS — F419 Anxiety disorder, unspecified: Secondary | ICD-10-CM

## 2023-02-01 DIAGNOSIS — Z87442 Personal history of urinary calculi: Secondary | ICD-10-CM

## 2023-02-01 DIAGNOSIS — M5030 Other cervical disc degeneration, unspecified cervical region: Secondary | ICD-10-CM

## 2023-02-01 DIAGNOSIS — M19071 Primary osteoarthritis, right ankle and foot: Secondary | ICD-10-CM

## 2023-02-01 DIAGNOSIS — M7061 Trochanteric bursitis, right hip: Secondary | ICD-10-CM

## 2023-02-01 DIAGNOSIS — Z8709 Personal history of other diseases of the respiratory system: Secondary | ICD-10-CM

## 2023-02-06 DIAGNOSIS — J019 Acute sinusitis, unspecified: Secondary | ICD-10-CM | POA: Diagnosis not present

## 2023-02-16 ENCOUNTER — Ambulatory Visit (INDEPENDENT_AMBULATORY_CARE_PROVIDER_SITE_OTHER): Payer: Medicare HMO

## 2023-02-16 ENCOUNTER — Ambulatory Visit: Payer: Medicare HMO | Attending: Rheumatology | Admitting: Rheumatology

## 2023-02-16 ENCOUNTER — Encounter: Payer: Self-pay | Admitting: Rheumatology

## 2023-02-16 VITALS — BP 172/80 | HR 63 | Resp 13 | Ht 62.0 in | Wt 137.8 lb

## 2023-02-16 DIAGNOSIS — M25511 Pain in right shoulder: Secondary | ICD-10-CM | POA: Diagnosis not present

## 2023-02-16 DIAGNOSIS — Z8719 Personal history of other diseases of the digestive system: Secondary | ICD-10-CM

## 2023-02-16 DIAGNOSIS — Z8709 Personal history of other diseases of the respiratory system: Secondary | ICD-10-CM | POA: Diagnosis not present

## 2023-02-16 DIAGNOSIS — M5030 Other cervical disc degeneration, unspecified cervical region: Secondary | ICD-10-CM | POA: Diagnosis not present

## 2023-02-16 DIAGNOSIS — Z8739 Personal history of other diseases of the musculoskeletal system and connective tissue: Secondary | ICD-10-CM

## 2023-02-16 DIAGNOSIS — F419 Anxiety disorder, unspecified: Secondary | ICD-10-CM

## 2023-02-16 DIAGNOSIS — Z87442 Personal history of urinary calculi: Secondary | ICD-10-CM | POA: Diagnosis not present

## 2023-02-16 DIAGNOSIS — M25562 Pain in left knee: Secondary | ICD-10-CM | POA: Diagnosis not present

## 2023-02-16 DIAGNOSIS — M25561 Pain in right knee: Secondary | ICD-10-CM

## 2023-02-16 DIAGNOSIS — M19042 Primary osteoarthritis, left hand: Secondary | ICD-10-CM

## 2023-02-16 DIAGNOSIS — G8929 Other chronic pain: Secondary | ICD-10-CM

## 2023-02-16 DIAGNOSIS — M19071 Primary osteoarthritis, right ankle and foot: Secondary | ICD-10-CM | POA: Diagnosis not present

## 2023-02-16 DIAGNOSIS — F32A Depression, unspecified: Secondary | ICD-10-CM

## 2023-02-16 DIAGNOSIS — M7061 Trochanteric bursitis, right hip: Secondary | ICD-10-CM | POA: Diagnosis not present

## 2023-02-16 DIAGNOSIS — M19072 Primary osteoarthritis, left ankle and foot: Secondary | ICD-10-CM

## 2023-02-16 DIAGNOSIS — M19041 Primary osteoarthritis, right hand: Secondary | ICD-10-CM | POA: Diagnosis not present

## 2023-02-16 MED ORDER — TRIAMCINOLONE ACETONIDE 40 MG/ML IJ SUSP
40.0000 mg | INTRAMUSCULAR | Status: AC | PRN
Start: 2023-02-16 — End: 2023-02-16
  Administered 2023-02-16: 40 mg via INTRA_ARTICULAR

## 2023-02-16 MED ORDER — LIDOCAINE HCL 1 % IJ SOLN
1.5000 mL | INTRAMUSCULAR | Status: AC | PRN
Start: 2023-02-16 — End: 2023-02-16
  Administered 2023-02-16: 1.5 mL

## 2023-02-16 NOTE — Addendum Note (Signed)
Addended by: Pollyann Savoy on: 02/16/2023 02:03 PM   Modules accepted: Level of Service

## 2023-02-16 NOTE — Progress Notes (Signed)
Office Visit Note  Patient: Tina Bray             Date of Birth: 02-26-1951           MRN: [id number]             PCP: Daylene Katayama, PA Referring: Georgina Quint, * Visit Date: 02/16/2023 Occupation: @GUAROCC @  Subjective:  Pain in both knees  History of Present Illness: Tina Bray is a 72 y.o. female with history of osteoarthritis and degenerative disc disease.  She states she had good response to right shoulder and right trochanteric bursa injection last year.  She states his discomfort in her right shoulder has been off and on.  She has difficulty lifting her right arm now.  She has difficulty when she sleeps on her right side.  She has intermittent discomfort in the right trochanteric region.  Recently she has been experiencing pain and discomfort in her bilateral knee joints.  Left knee joint is more painful.  She is going on a trip and would like to have a cortisone injection.  She was also seen by Dr. Al Corpus recently for bilateral feet osteoarthritis and had cortisone injections which helped a lot.  She has off-and-on discomfort in her cervical spine.  She describes some tenderness in the left trapezius region.    Activities of Daily Living:  Patient reports morning stiffness for 15-20 minutes.   Patient Reports nocturnal pain.  Difficulty dressing/grooming: Denies Difficulty climbing stairs: Denies Difficulty getting out of chair: Denies Difficulty using hands for taps, buttons, cutlery, and/or writing: Denies  Review of Systems  Constitutional:  Positive for fatigue.  HENT:  Negative for mouth sores and mouth dryness.   Eyes:  Negative for dryness.  Respiratory:  Negative for shortness of breath.   Cardiovascular:  Negative for chest pain and palpitations.  Gastrointestinal:  Negative for blood in stool, constipation and diarrhea.  Endocrine: Negative for increased urination.  Genitourinary:  Negative for involuntary urination.   Musculoskeletal:  Positive for joint pain, gait problem, joint pain, myalgias, morning stiffness, muscle tenderness and myalgias. Negative for joint swelling and muscle weakness.  Skin:  Negative for color change, hair loss and sensitivity to sunlight.  Allergic/Immunologic: Negative for susceptible to infections.  Neurological:  Positive for headaches. Negative for dizziness.  Hematological:  Negative for swollen glands.  Psychiatric/Behavioral:  Negative for depressed mood and sleep disturbance. The patient is not nervous/anxious.     PMFS History:  Patient Active Problem List   Diagnosis Date Noted   Peripheral arterial disease (HCC) 09/16/2022   Diverticulosis 11/18/2020   Arthritis of right shoulder region 11/18/2020   Dysthymia 11/18/2020   Panic attack 11/18/2020   Atrophic vaginitis 08/12/2020   Osteopenia 08/12/2020   Pain of left hand 07/30/2019   Arthritis of carpometacarpal [facility]) joint of right thumb 04/13/2018   Carpal tunnel syndrome of right wrist 01/22/2018   IBS (irritable bowel syndrome) 10/26/2015   Anxiety 11/14/2011   GERD (gastroesophageal reflux disease) 11/14/2011    Past Medical History:  Diagnosis Date   Allergy    Anxiety    Asthma    Depression    GERD (gastroesophageal reflux disease)    IBS (irritable bowel syndrome)    Kidney stones    Meningitis    Mitral valve prolapse    Osteopenia    Pneumonia     Family History  Problem Relation Age of Onset   Heart disease Mother 57  cardiac stenting/CAD   Hypertension Mother    Lung cancer Mother    Diabetes Mother    Cancer Mother    Stroke Father    Alcohol abuse Father    Arthritis Brother    Anxiety disorder Daughter    Past Surgical History:  Procedure Laterality Date   APPENDECTOMY     CARPAL TUNNEL RELEASE     right and left wrist, right x 3   CHOLECYSTECTOMY     COLONOSCOPY  09/05/1998   normal; repeat in ten years.   COSMETIC SURGERY     eyelids   KNEE ARTHROSCOPY Left     orthoscopic shoulder     right shoulder   SPINE SURGERY     cervical spine C5-6   TONSILLECTOMY     TUBAL LIGATION     Social History   Social History Narrative   Marital status:  Married x 14 years; happily married.  No abuse.  Moved from [place].      Children:  2 daughters; 1 grandson.      Lives:  With husband.      Employment:  Surveyor, minerals.        Tobacco: none      Alcohol: 4 glasses of wine weekly.      Drugs:none      Exercise: yes; walking and running   Immunization History  Administered Date(s) Administered   Fluad Quad(high Dose 65+) 06/23/2019, 06/17/2020   Influenza Inj Mdck Quad With Preservative 06/16/2018   Influenza,inj,Quad PF,6+ Mos 06/04/2015, 07/08/2016   Influenza-Unspecified 06/16/2018, 06/24/2019, 06/05/2020, 06/08/2022   PFIZER Comirnaty(Gray Top)Covid-19 Tri-Sucrose Vaccine 10/27/2019, 11/20/2019, 01/02/2021   PFIZER(Purple Top)SARS-COV-2 Vaccination 10/27/2019, 11/20/2019   Pfizer Covid-19 Vaccine Bivalent Booster 52yrs & up 07/03/2021   Pneumococcal Conjugate-13 11/15/2017   Pneumococcal Polysaccharide-23 01/31/2019   Pneumococcal-Unspecified 06/14/2020, 06/08/2022   Respiratory Syncytial Virus Vaccine,Recomb Aduvanted(Arexvy) 06/07/2022   Tdap 12/11/2013   Zoster Recombinat (Shingrix) 07/19/2022   Zoster, Live 06/08/2022     Objective: Vital Signs: BP (!) 172/80 (BP Location: Right Arm, Patient Position: Sitting, Cuff Size: Normal)   Pulse 63   Resp 13   Ht 5\' 2"  (1.575 m)   Wt 137 lb 12.8 oz (62.5 kg)   BMI 25.20 kg/m    Physical Exam Vitals and nursing note reviewed.  Constitutional:      Appearance: She is well-developed.  HENT:     Head: Normocephalic and atraumatic.  Eyes:     Conjunctiva/sclera: Conjunctivae normal.  Cardiovascular:     Rate and Rhythm: Normal rate and regular rhythm.     Heart sounds: Normal heart sounds.  Pulmonary:     Effort: Pulmonary effort is normal.     Breath sounds: Normal  breath sounds.  Abdominal:     General: Bowel sounds are normal.     Palpations: Abdomen is soft.  Musculoskeletal:     Cervical back: Normal range of motion.  Lymphadenopathy:     Cervical: No cervical adenopathy.  Skin:    General: Skin is warm and dry.     Capillary Refill: Capillary refill takes less than 2 seconds.  Neurological:     Mental Status: She is alert and oriented to person, place, and time.  Psychiatric:        Behavior: Behavior normal.      Musculoskeletal Exam: She had limited lateral portion of the cervical spine without discomfort.  Thoracic and lumbar spine were in good range of motion.  Right shoulder joint abduction  was limited to 120 degrees and she had discomfort and limitation with internal rotation.  She had tenderness over the glenohumeral and subacromial joints.  No effusion was noted.  Left shoulder joint was in full range of motion.  Elbow joints and wrist joints in good range of motion.  She had bilateral CMC PIP and DIP thickening with no synovitis.  Hip joints were in good range of motion.  She did not have tenderness over trochanteric bursa.  Knee joints were in good range of motion without any warmth swelling or effusion.  She had discomfort range of motion of her left knee joint.  There was no tenderness over ankles or MTPs.  CDAI Exam: CDAI Score: -- Patient Global: --; Provider Global: -- Swollen: --; Tender: -- Joint Exam 02/16/2023   No joint exam has been documented for this visit   There is currently no information documented on the homunculus. Go to the Rheumatology activity and complete the homunculus joint exam.  Investigation: No additional findings.  Imaging: XR KNEE 3 VIEW LEFT  Result Date: 02/16/2023 No medial or lateral compartment narrowing was noted.  Moderate patellofemoral narrowing was noted.  Chondrocalcinosis was noted. Impression: Moderate patellofemoral narrowing was noted and chondrocalcinosis was noted.   Recent  Labs: Lab Results  Component Value Date   WBC 3.3 (L) 11/15/2017   HGB 14.3 11/15/2017   PLT 220 11/15/2017   NA 141 01/01/2020   K 4.2 01/01/2020   CL 104 01/01/2020   CO2 24 01/01/2020   GLUCOSE 82 01/01/2020   BUN 16 01/01/2020   CREATININE 0.80 01/01/2020   BILITOT 0.5 01/01/2020   ALKPHOS 84 01/01/2020   AST 25 01/01/2020   ALT 22 01/01/2020   PROT 6.5 01/01/2020   ALBUMIN 4.4 01/01/2020   CALCIUM 9.9 01/01/2020   GFRAA 86 01/01/2020    Speciality Comments: No specialty comments available.  Procedures:  Large Joint Inj: L knee on 02/16/2023 1:38 PM Indications: pain Details: 27 G 1.5 in needle, medial approach  Arthrogram: No  Medications: 40 mg triamcinolone acetonide 40 MG/ML; 1.5 mL lidocaine 1 % Aspirate: 0 mL Outcome: tolerated well, no immediate complications Procedure, treatment alternatives, risks and benefits explained, specific risks discussed. Consent was given by the patient. Immediately prior to procedure a time out was called to verify the correct patient, procedure, equipment, support staff and site/side marked as required. Patient was prepped and draped in the usual sterile fashion.     Allergies: Erythromycin, Demerol, Penicillamine, Penicillins, and Sulfa antibiotics   Assessment / Plan:     Visit Diagnoses: Primary osteoarthritis of both hands -she continues to have pain and discomfort in her bilateral hands.  Bilateral CMC PIP and DIP thickening was noted.  No synovitis was noted.  History of right CMC injection x2.  Right carpal tunnel release x3.  Joint protection was discussed.  Chronic right shoulder pain-she continues to have discomfort in the right shoulder joint.  Patient states she had good response to cortisone injection and would like to have repeat cortisone injection.  Her blood pressure was elevated today.  We decided not to given injection another injection.  A handout on shoulder joint exercises was given.  I will also refer her to  Dr. August Saucer.  Trochanteric bursitis, right hip -good range of motion of her hip joints.  She has no tenderness over right trochanteric bursa today.  She reports intermittent discomfort.  X-ray of the right hip joint was unremarkable  Chronic pain of both knees-she  complains of pain and discomfort in the bilateral knee joints.  Left knee joint is more painful.  No warmth swelling or effusion was noted.  X-rays of the left knee joint were obtained today.  This showed chondrocalcinosis.  No significant tibiofemoral joint space narrowing was noted.  Moderate medial compartment narrowing was noted.  X-ray findings were discussed with the patient.  Association of chondrocalcinosis with pseudogout was discussed.  Patient currently does not have any symptoms of pseudogout.  Per patient's request left knee joint was injected with lidocaine and Kenalog as described above.  Patient tolerated the procedure well.  Postprocedure instructions were given.  Use of knee brace was advised.  A handout on knee exercises was given.  Primary osteoarthritis of both feet - History of right ankle joint fracture in the past.  She has been experiencing increased discomfort in her feet.  She states she had cortisone injection by Dr. Al Corpus and since then her symptoms have improved.  DDD (degenerative disc disease), cervical -she continues to have limited lateral rotation of the cervical spine.  X-rays obtained in the past showed multilevel spondylosis and facet joint arthropathy.  Status post C5-C6 fusion 1996.  History of osteopenia - I do not have DEXA results.  History of gastroesophageal reflux (GERD)  History of IBS  History of kidney stones  History of asthma  Anxiety and depression  Orders: Orders Placed This Encounter  Procedures   Large Joint Inj   XR KNEE 3 VIEW LEFT   No orders of the defined types were placed in this encounter.    Follow-Up Instructions: Return in about 6 months (around 08/18/2023) for  Osteoarthritis.   Pollyann Savoy, MD  Note - This record has been created using Animal nutritionist.  Chart creation errors have been sought, but may not always  have been located. Such creation errors do not reflect on  the standard of medical care.

## 2023-02-16 NOTE — Patient Instructions (Signed)
Shoulder Exercises Ask your health care provider which exercises are safe for you. Do exercises exactly as told by your health care provider and adjust them as directed. It is normal to feel mild stretching, pulling, tightness, or discomfort as you do these exercises. Stop right away if you feel sudden pain or your pain gets worse. Do not begin these exercises until told by your health care provider. Stretching exercises External rotation and abduction This exercise is sometimes called corner stretch. The exercise rotates your arm outward (external rotation) and moves your arm out from your body (abduction). Stand in a doorway with one of your feet slightly in front of the other. This is called a staggered stance. If you cannot reach your forearms to the door frame, stand facing a corner of a room. Choose one of the following positions as told by your health care provider: Place your hands and forearms on the door frame above your head. Place your hands and forearms on the door frame at the height of your head. Place your hands on the door frame at the height of your elbows. Slowly move your weight onto your front foot until you feel a stretch across your chest and in the front of your shoulders. Keep your head and chest upright and keep your abdominal muscles tight. Hold for __________ seconds. To release the stretch, shift your weight to your back foot. Repeat __________ times. Complete this exercise __________ times a day. Extension, standing  Stand and hold a broomstick, a cane, or a similar object behind your back. Your hands should be a little wider than shoulder-width apart. Your palms should face away from your back. Keeping your elbows straight and your shoulder muscles relaxed, move the stick away from your body until you feel a stretch in your shoulders (extension). Avoid shrugging your shoulders while you move the stick. Keep your shoulder blades tucked down toward the middle of your  back. Hold for __________ seconds. Slowly return to the starting position. Repeat __________ times. Complete this exercise __________ times a day. Range-of-motion exercises Pendulum  Stand near a wall or a surface that you can hold onto for balance. Bend at the waist and let your left / right arm hang straight down. Use your other arm to support you. Keep your back straight and do not lock your knees. Relax your left / right arm and shoulder muscles, and move your hips and your trunk so your left / right arm swings freely. Your arm should swing because of the motion of your body, not because you are using your arm or shoulder muscles. Keep moving your hips and trunk so your arm swings in the following directions, as told by your health care provider: Side to side. Forward and backward. In clockwise and counterclockwise circles. Continue each motion for __________ seconds, or for as long as told by your health care provider. Slowly return to the starting position. Repeat __________ times. Complete this exercise __________ times a day. Shoulder flexion, standing  Stand and hold a broomstick, a cane, or a similar object. Place your hands a little more than shoulder-width apart on the object. Your left / right hand should be palm-up, and your other hand should be palm-down. Keep your elbow straight and your shoulder muscles relaxed. Push the stick up with your healthy arm to raise your left / right arm in front of your body, and then over your head until you feel a stretch in your shoulder (flexion). Avoid shrugging your shoulder   while you raise your arm. Keep your shoulder blade tucked down toward the middle of your back. Hold for __________ seconds. Slowly return to the starting position. Repeat __________ times. Complete this exercise __________ times a day. Shoulder abduction, standing  Stand and hold a broomstick, a cane, or a similar object. Place your hands a little more than  shoulder-width apart on the object. Your left / right hand should be palm-up, and your other hand should be palm-down. Keep your elbow straight and your shoulder muscles relaxed. Push the object across your body toward your left / right side. Raise your left / right arm to the side of your body (abduction) until you feel a stretch in your shoulder. Do not raise your arm above shoulder height unless your health care provider tells you to do that. If directed, raise your arm over your head. Avoid shrugging your shoulder while you raise your arm. Keep your shoulder blade tucked down toward the middle of your back. Hold for __________ seconds. Slowly return to the starting position. Repeat __________ times. Complete this exercise __________ times a day. Internal rotation  Place your left / right hand behind your back, palm-up. Use your other hand to dangle an exercise band, a broomstick, or a similar object over your shoulder. Grasp the band with your left / right hand so you are holding on to both ends. Gently pull up on the band until you feel a stretch in the front of your left / right shoulder. The movement of your arm toward the center of your body is called internal rotation. Avoid shrugging your shoulder while you raise your arm. Keep your shoulder blade tucked down toward the middle of your back. Hold for __________ seconds. Release the stretch by letting go of the band and lowering your hands. Repeat __________ times. Complete this exercise __________ times a day. Strengthening exercises External rotation  Sit in a stable chair without armrests. Secure an exercise band to a stable object at elbow height on your left / right side. Place a soft object, such as a folded towel or a small pillow, between your left / right upper arm and your body to move your elbow about 4 inches (10 cm) away from your side. Hold the end of the exercise band so it is tight and there is no slack. Keeping your  elbow pressed against the soft object, slowly move your forearm out, away from your abdomen (external rotation). Keep your body steady so only your forearm moves. Hold for __________ seconds. Slowly return to the starting position. Repeat __________ times. Complete this exercise __________ times a day. Shoulder abduction  Sit in a stable chair without armrests, or stand up. Hold a __________ lb / kg weight in your left / right hand, or hold an exercise band with both hands. Start with your arms straight down and your left / right palm facing in, toward your body. Slowly lift your left / right hand out to your side (abduction). Do not lift your hand above shoulder height unless your health care provider tells you that this is safe. Keep your arms straight. Avoid shrugging your shoulder while you do this movement. Keep your shoulder blade tucked down toward the middle of your back. Hold for __________ seconds. Slowly lower your arm, and return to the starting position. Repeat __________ times. Complete this exercise __________ times a day. Shoulder extension  Sit in a stable chair without armrests, or stand up. Secure an exercise band to a   stable object in front of you so it is at shoulder height. Hold one end of the exercise band in each hand. Straighten your elbows and lift your hands up to shoulder height. Squeeze your shoulder blades together as you pull your hands down to the sides of your thighs (extension). Stop when your hands are straight down by your sides. Do not let your hands go behind your body. Hold for __________ seconds. Slowly return to the starting position. Repeat __________ times. Complete this exercise __________ times a day. Shoulder row  Sit in a stable chair without armrests, or stand up. Secure an exercise band to a stable object in front of you so it is at chest height. Hold one end of the exercise band in each hand. Position your palms so that your thumbs are  facing the ceiling (neutral position). Bend each of your elbows to a 90-degree angle (right angle) and keep your upper arms at your sides. Step back or move the chair back until the band is tight and there is no slack. Slowly pull your elbows back behind you. Hold for __________ seconds. Slowly return to the starting position. Repeat __________ times. Complete this exercise __________ times a day. Shoulder press-ups  Sit in a stable chair that has armrests. Sit upright, with your feet flat on the floor. Put your hands on the armrests so your elbows are bent and your fingers are pointing forward. Your hands should be about even with the sides of your body. Push down on the armrests and use your arms to lift yourself off the chair. Straighten your elbows and lift yourself up as much as you comfortably can. Move your shoulder blades down, and avoid letting your shoulders move up toward your ears. Keep your feet on the ground. As you get stronger, your feet should support less of your body weight as you lift yourself up. Hold for __________ seconds. Slowly lower yourself back into the chair. Repeat __________ times. Complete this exercise __________ times a day. Wall push-ups  Stand so you are facing a stable wall. Your feet should be about one arm-length away from the wall. Lean forward and place your palms on the wall at shoulder height. Keep your feet flat on the floor as you bend your elbows and lean forward toward the wall. Hold for __________ seconds. Straighten your elbows to push yourself back to the starting position. Repeat __________ times. Complete this exercise __________ times a day. This information is not intended to replace advice given to you by your health care provider. Make sure you discuss any questions you have with your health care provider. Document Revised: 10/12/2021 Document Reviewed: 10/12/2021 Elsevier Patient Education  2024 [employer] Exercises for Chronic  Knee Pain Chronic knee pain is pain that lasts longer than 3 months. For most people with chronic knee pain, exercise and weight loss is an important part of treatment. Your health care provider may want you to focus on: Making the muscles that support your knee stronger. This can take pressure off your knee and reduce pain. Preventing knee stiffness. How far you can move your knee, keeping it there or making it farther. Losing weight (if this applies) to take pressure off your knee, lower your risk for injury, and make it easier for you to exercise. Your provider will help you make an exercise program that fits your needs and physical abilities. Below are simple, low-impact exercises you can do at home. Ask your provider or physical therapist how  often you should do your exercise program and how many times to repeat each exercise. General safety tips  Get your provider's approval before doing any exercises. Start slowly and stop any time you feel pain. Do not exercise if your knee pain is flaring up. Warm up first. Stretching a cold muscle can cause an injury. Do 5-10 minutes of easy movement or light stretching before beginning your exercises. Do 5-10 minutes of low-impact activity (like walking or cycling) before starting strengthening exercises. Contact your provider any time you have pain during or after exercising. Exercise can cause discomfort but should not be painful. It is normal to be a little stiff or sore after exercising. Stretching and range-of-motion exercises Front thigh stretch  Stand up straight and support your body by holding on to a chair or resting one hand on a wall. With your legs straight and close together, bend one knee to lift your heel up toward your butt. Using one hand for support, grab your ankle with your free hand. Pull your foot up closer toward your butt to feel the stretch in front of your thigh. Hold the stretch for 30 seconds. Repeat __________ times.  Complete this exercise __________ times a day. Back thigh stretch  Sit on the floor with your back straight and your legs out straight in front of you. Place the palms of your hands on the floor and slide them toward your feet as you bend at the hip. Try to touch your nose to your knees and feel the stretch in the back of your thighs. Hold for 30 seconds. Repeat __________ times. Complete this exercise __________ times a day. Calf stretch  Stand facing a wall. Place the palms of your hands flat against the wall, arms extended, and lean slightly against the wall. Get into a lunge position with one leg bent at the knee and the other leg stretched out straight behind you. Keep both feet facing the wall and increase the bend in your knee while keeping the heel of the other leg flat on the ground. You should feel the stretch in your calf. Hold for 30 seconds. Repeat __________ times. Complete this exercise __________ times a day. Strengthening exercises Straight leg lift  Lie on your back with one knee bent and the other leg out straight. Slowly lift the straight leg without bending the knee. Lift until your foot is about 12 inches (30 cm) off the floor. Hold for 3-5 seconds and slowly lower your leg. Repeat __________ times. Complete this exercise __________ times a day. Single leg dip  Stand between two chairs and put both hands on the backs of the chairs for support. Extend one leg out straight with your body weight resting on the heel of the standing leg. Slowly bend your standing knee to dip your body to the level that is comfortable for you. Hold for 3-5 seconds. Repeat __________ times. Complete this exercise __________ times a day. Hamstring curls  Stand straight, knees close together, facing the back of a chair. Hold on to the back of a chair with both hands. Keep one leg straight. Bend the other knee while bringing the heel up toward the butt until the knee is bent at a  90-degree angle (right angle). Hold for 3-5 seconds. Repeat __________ times. Complete this exercise __________ times a day. Wall squat  Stand straight with your back, hips, and head against a wall. Step forward one foot at a time with your back still against the wall.  Your feet should be 2 feet (61 cm) from the wall at shoulder width. Keeping your back, hips, and head against the wall, slide down the wall to as close to a sitting position as you can get. Hold for 5-10 seconds, then slowly slide back up. Repeat __________ times. Complete this exercise __________ times a day. Step-ups  Stand in front of a sturdy platform or stool that is about 6 inches (15 cm) high. Slowly step up with your left / right foot, keeping your knee in line with your hip and foot. Do not let your knee bend so far that you cannot see your toes. Hold on to a chair for balance, but do not use it for support. Slowly unlock your knee and lower yourself to the starting position. Repeat __________ times. Complete this exercise __________ times a day. Contact a health care provider if: Your exercises cause pain. Your pain is worse after you exercise. Your pain prevents you from doing your exercises. This information is not intended to replace advice given to you by your health care provider. Make sure you discuss any questions you have with your health care provider. Document Revised: 09/06/2022 Document Reviewed: 09/06/2022 Elsevier Patient Education  2024 [employer].

## 2023-02-16 NOTE — Addendum Note (Signed)
Addended by: Ellen Henri on: 02/16/2023 02:35 PM   Modules accepted: Orders

## 2023-02-17 DIAGNOSIS — I10 Essential (primary) hypertension: Secondary | ICD-10-CM | POA: Diagnosis not present

## 2023-03-02 ENCOUNTER — Encounter: Payer: Self-pay | Admitting: Orthopedic Surgery

## 2023-03-02 ENCOUNTER — Other Ambulatory Visit: Payer: Self-pay

## 2023-03-02 ENCOUNTER — Ambulatory Visit (INDEPENDENT_AMBULATORY_CARE_PROVIDER_SITE_OTHER): Payer: Medicare HMO | Admitting: Orthopedic Surgery

## 2023-03-02 DIAGNOSIS — M25511 Pain in right shoulder: Secondary | ICD-10-CM | POA: Diagnosis not present

## 2023-03-02 NOTE — Progress Notes (Signed)
Office Visit Note   Patient: Tina Bray           Date of Birth: Sep 10, 1950           MRN: [id number] Visit Date: 03/02/2023 Requested by: Pollyann Savoy, MD [address] [place],  [place] [address] PCP: Daylene Katayama, [place]  Subjective: Chief Complaint  Patient presents with   Right Shoulder - Pain    HPI: Tina Bray is a 72 y.o. female who presents to the office reporting right shoulder pain for over a year.  She injured her shoulder in 1992.  Has a history of frozen shoulder for 2 years as well as history of rotator cuff repair in the 1990s.  Patient is right-hand dominant.  She reports a crunching sensation in the shoulder.  Pain wakes her from sleep at night.  She reports difficulty with ADLs along with radicular pain with some neck pain and occasional numbness and tingling.  Over-the-counter arthritis medications help.  Has a history of C-spine fusion at C5-6 from the 1990s.  Last injection in the shoulder was over a year ago which did help some.  Pain is that her biggest complaint more so than stiffness or weakness.  She does like to walk for exercise.  She has a 1 acre garden.  Left shoulder has no problems.  Sometimes she cannot lift her arm..                ROS: All systems reviewed are negative as they relate to the chief complaint within the history of present illness.  Patient denies fevers or chills.  Assessment & Plan: Visit Diagnoses:  1. Right shoulder pain, unspecified chronicity     Plan: Impression is right shoulder arthritis.  She has surprisingly good rotator cuff strength and range of motion considering the amount of radiographic arthritis present.  Does have a stable calcification in the inferior humeral neck region.  Plan at this time is ultrasound-guided shoulder injection with 73-month return.  Based on her good strength and range of motion I think she may be several years away from shoulder replacement.  Follow-up in 4  months for clinical recheck.  Follow-Up Instructions: No follow-ups on file.   Orders:  Orders Placed This Encounter  Procedures   US Guided Needle Placement - No Linked Charges   No orders of the defined types were placed in this encounter.     Procedures: No procedures performed   Clinical Data: No additional findings.  Objective: Vital Signs: There were no vitals taken for this visit.  Physical Exam:  Constitutional: Patient appears well-developed HEENT:  Head: Normocephalic Eyes:EOM are normal Neck: Normal range of motion Cardiovascular: Normal rate Pulmonary/chest: Effort normal Neurologic: Patient is alert Skin: Skin is warm Psychiatric: Patient has normal mood and affect  Ortho Exam: Ortho exam demonstrates passive range of motion on the right of 60/100/165.  She has pretty reasonable rotator cuff strength infraspinatus supraspinatus and subscap muscle testing.  Very mild coarseness with passive range of motion at 90 degrees of abduction on the right.  No discrete AC joint tenderness.  No masses lymphadenopathy or skin changes noted in that shoulder girdle region.  Specialty Comments:  No specialty comments available.  Imaging: US Guided Needle Placement - No Linked Charges  Result Date: 03/02/2023 Ultrasound imaging demonstrates needle placement into the right glenohumeral joint with extravasation of fluid and no complicating features    PMFS History: Patient Active Problem List  Diagnosis Date Noted   Peripheral arterial disease (HCC) 09/16/2022   Diverticulosis 11/18/2020   Arthritis of right shoulder region 11/18/2020   Dysthymia 11/18/2020   Panic attack 11/18/2020   Atrophic vaginitis 08/12/2020   Osteopenia 08/12/2020   Pain of left hand 07/30/2019   Arthritis of carpometacarpal [facility]) joint of right thumb 04/13/2018   Carpal tunnel syndrome of right wrist 01/22/2018   IBS (irritable bowel syndrome) 10/26/2015   Anxiety 11/14/2011   GERD  (gastroesophageal reflux disease) 11/14/2011   Past Medical History:  Diagnosis Date   Allergy    Anxiety    Asthma    Depression    GERD (gastroesophageal reflux disease)    IBS (irritable bowel syndrome)    Kidney stones    Meningitis    Mitral valve prolapse    Osteopenia    Pneumonia     Family History  Problem Relation Age of Onset   Heart disease Mother 43       cardiac stenting/CAD   Hypertension Mother    Lung cancer Mother    Diabetes Mother    Cancer Mother    Stroke Father    Alcohol abuse Father    Arthritis Brother    Anxiety disorder Daughter     Past Surgical History:  Procedure Laterality Date   APPENDECTOMY     CARPAL TUNNEL RELEASE     right and left wrist, right x 3   CHOLECYSTECTOMY     COLONOSCOPY  09/05/1998   normal; repeat in ten years.   COSMETIC SURGERY     eyelids   KNEE ARTHROSCOPY Left    orthoscopic shoulder     right shoulder   SPINE SURGERY     cervical spine C5-6   TONSILLECTOMY     TUBAL LIGATION     Social History   Occupational History   Occupation: retired  Tobacco Use   Smoking status: Never    Passive exposure: Current   Smokeless tobacco: Never  Vaping Use   Vaping Use: Never used  Substance and Sexual Activity   Alcohol use: Yes    Comment: occ   Drug use: No   Sexual activity: Yes    Birth control/protection: None

## 2023-03-11 IMAGING — MG MM DIGITAL DIAGNOSTIC UNILAT*L* W/ TOMO W/ CAD
4 series · 4 of 12 positions shown · non-contrast
Comparison: Previous exam(s).

CLINICAL DATA: 71-year-old female presenting as a recall from
screening for possible left breast asymmetry.

EXAM:
DIGITAL DIAGNOSTIC UNILATERAL LEFT MAMMOGRAM WITH TOMOSYNTHESIS AND
CAD
TECHNIQUE: Left digital diagnostic mammography and breast tomosynthesis was
performed. The images were evaluated with computer-aided detection.

[L ML synth-2D]
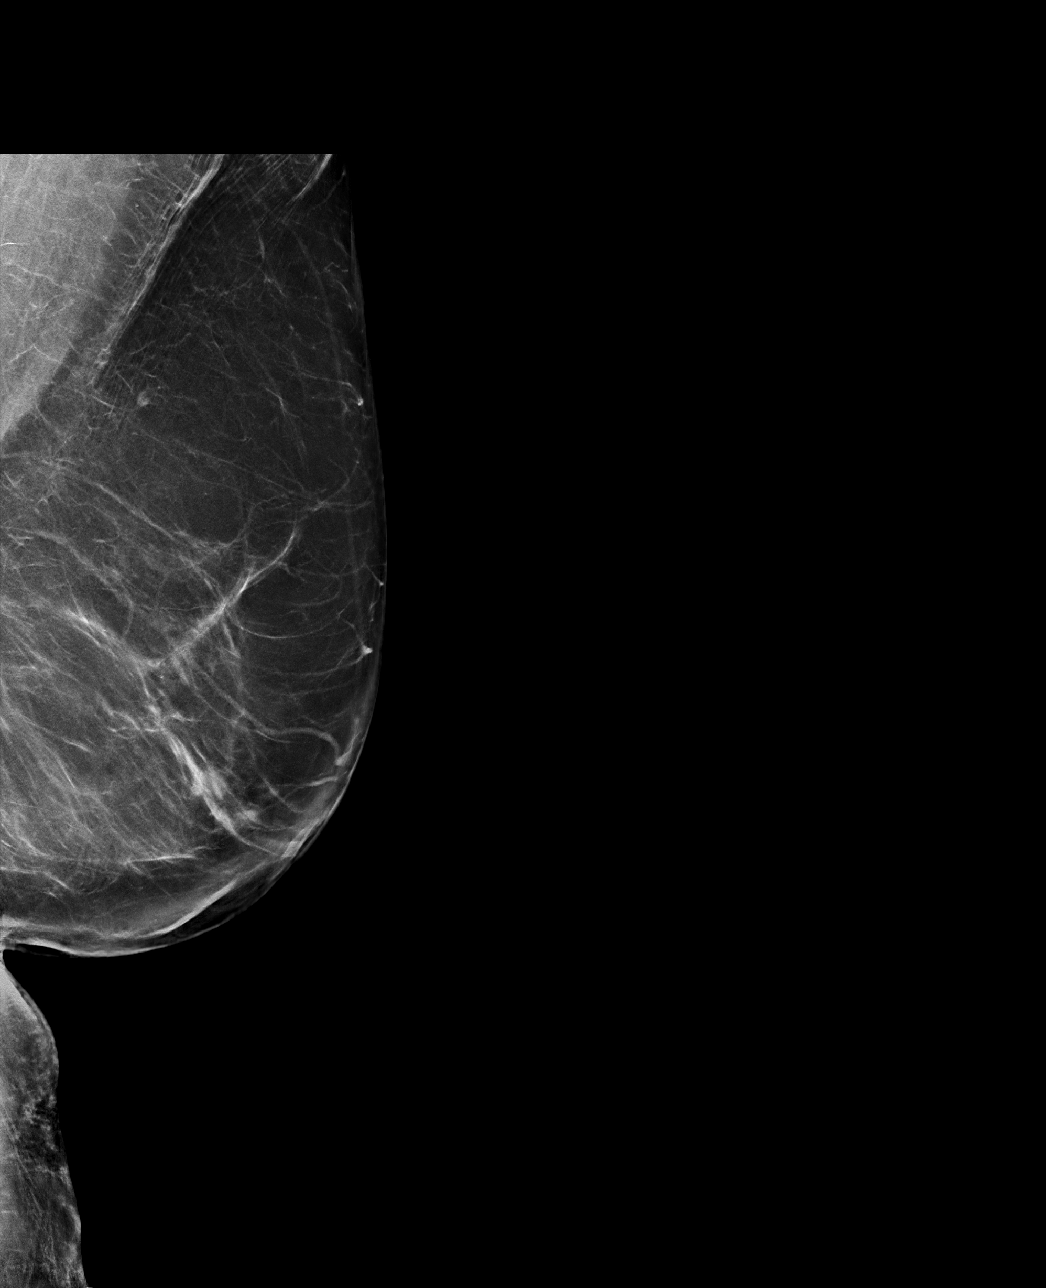

[L MLO synth-2D]
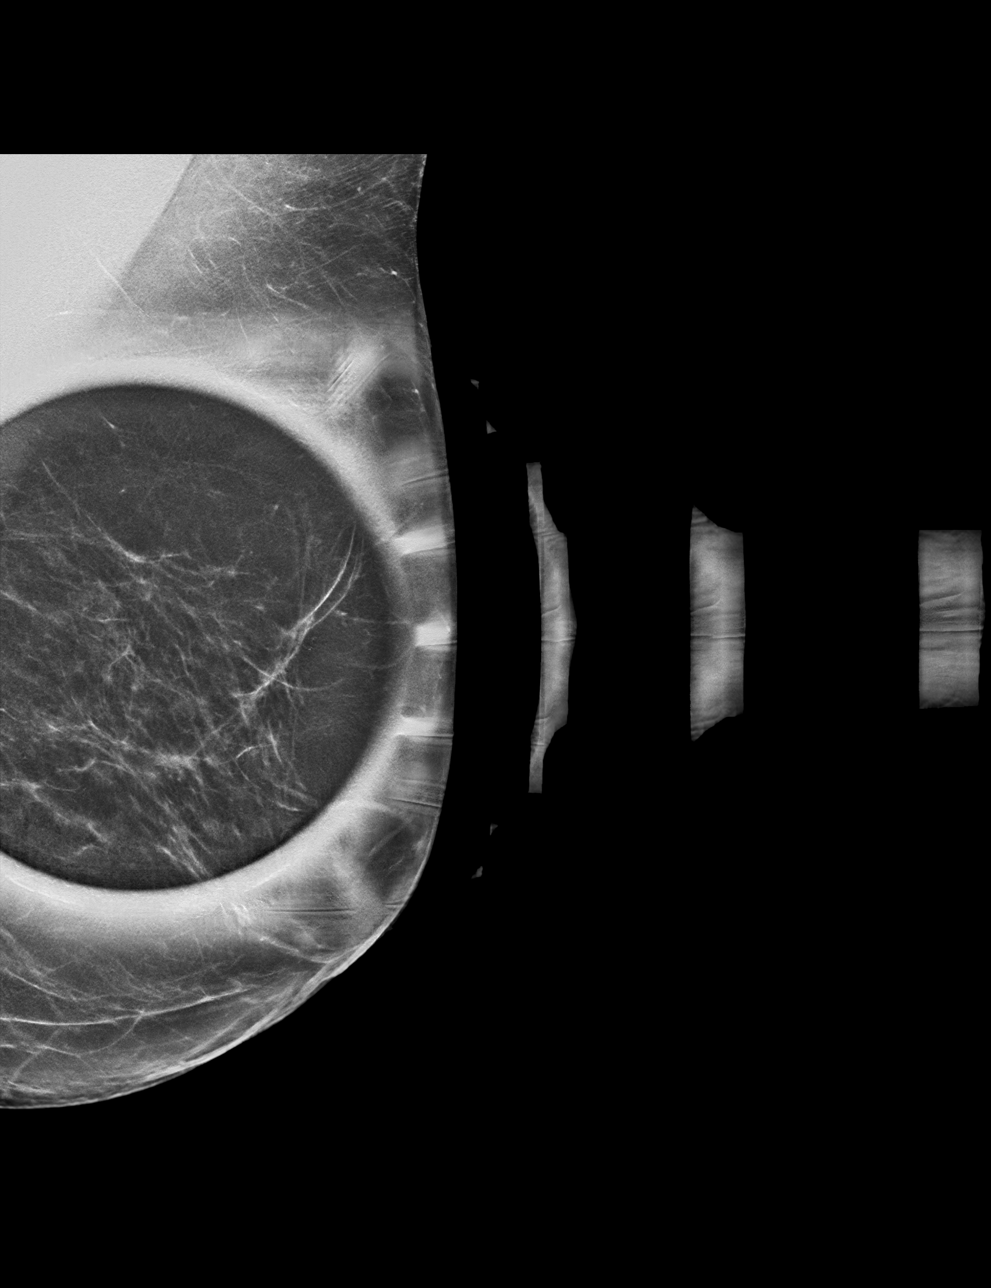

[L ML tomo · tomo slice 41/80.0]
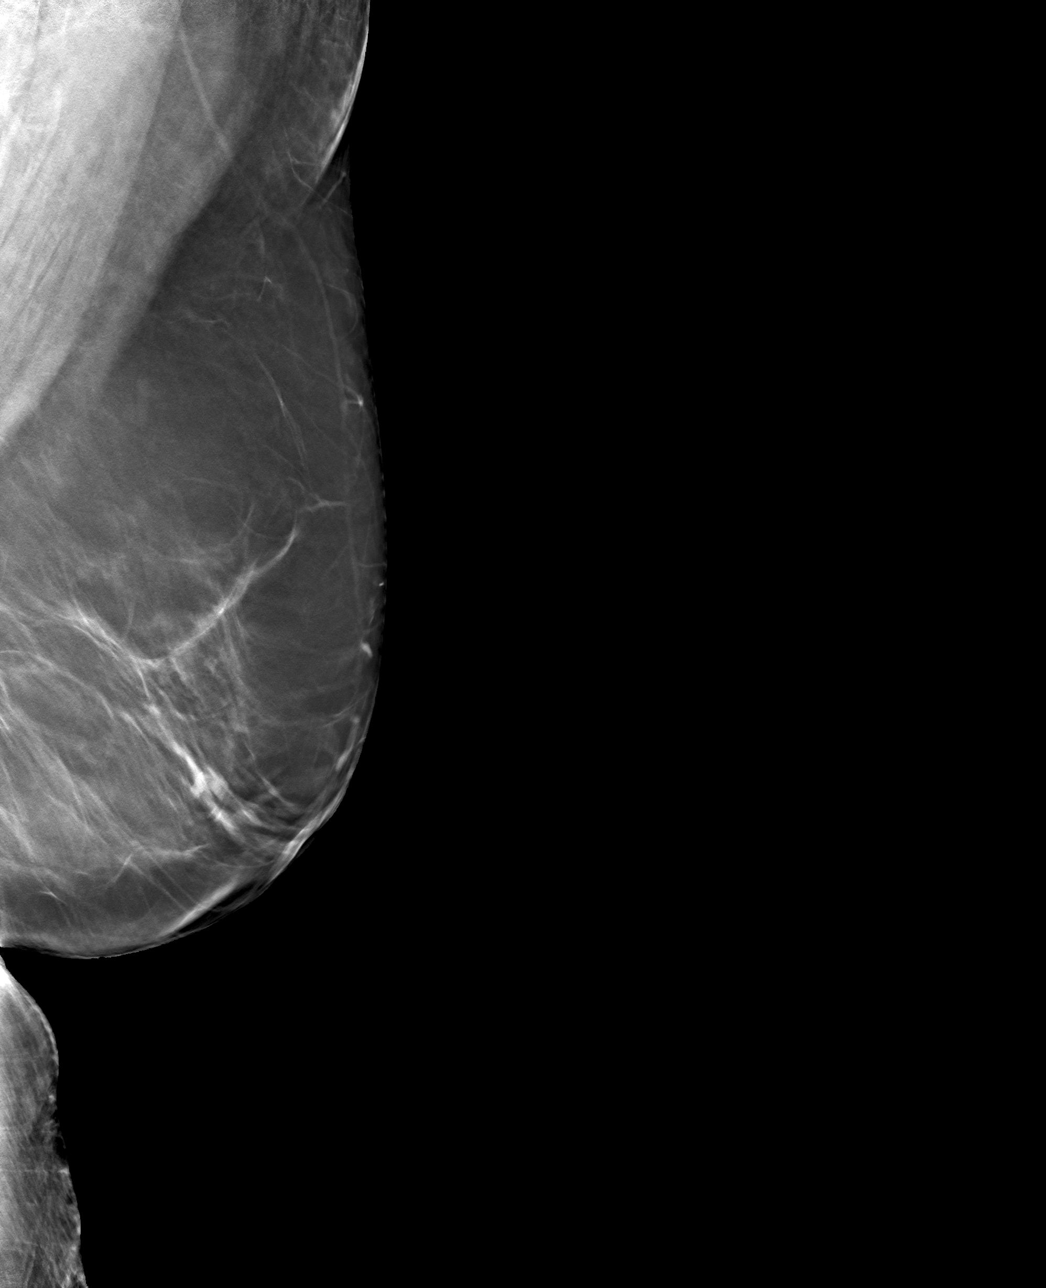

[L MLO tomo · tomo slice 31/60.0]
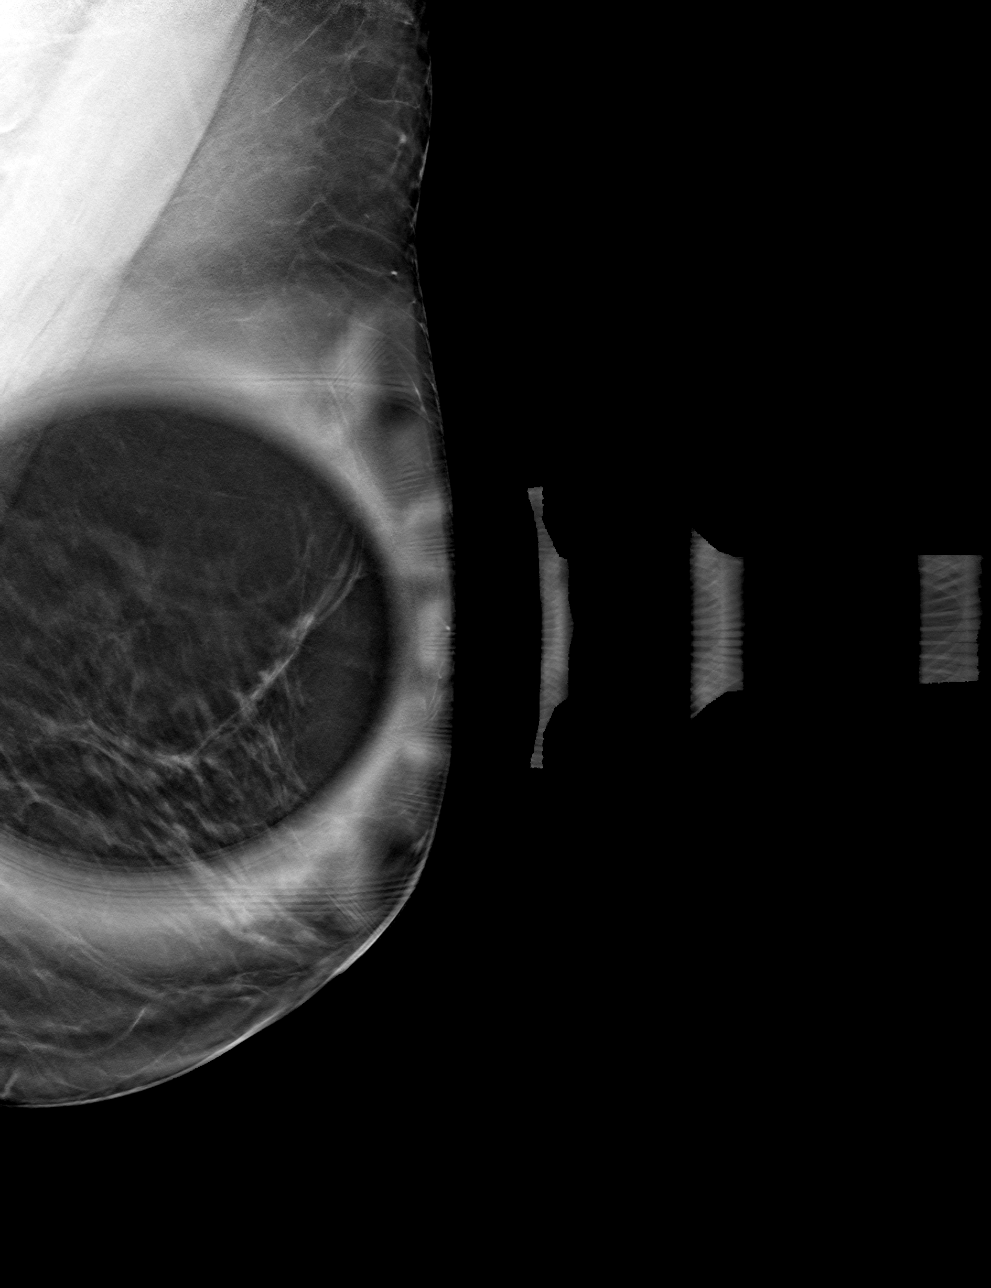

[4 of 12 positions shown; findings below may reference images not displayed]

ACR Breast Density Category b: There are scattered areas of
fibroglandular density.
FINDINGS: Spot compression tomosynthesis MLO and full field mL tomosynthesis
views of the left breast were performed for a questioned asymmetry
seen only on MLO view in the superior left breast. On the additional
imaging the tissue in this area disperses without persistent
asymmetry, mass, or distortion. There are no new suspicious findings
in the left breast.
IMPRESSION: No mammographic evidence of malignancy in the left breast.

RECOMMENDATION:
Screening mammogram in one year.(Code:Q0-8-0G7)

I have discussed the findings and recommendations with the patient.
If applicable, a reminder letter will be sent to the patient
regarding the next appointment.

BI-RADS CATEGORY  1: Negative.

## 2023-04-30 DIAGNOSIS — S61512A Laceration without foreign body of left wrist, initial encounter: Secondary | ICD-10-CM | POA: Diagnosis not present

## 2023-04-30 DIAGNOSIS — Z23 Encounter for immunization: Secondary | ICD-10-CM | POA: Diagnosis not present

## 2023-05-05 DIAGNOSIS — Z4802 Encounter for removal of sutures: Secondary | ICD-10-CM | POA: Diagnosis not present

## 2023-05-15 DIAGNOSIS — Z23 Encounter for immunization: Secondary | ICD-10-CM | POA: Diagnosis not present

## 2023-05-15 DIAGNOSIS — I10 Essential (primary) hypertension: Secondary | ICD-10-CM | POA: Diagnosis not present

## 2023-06-27 DIAGNOSIS — E782 Mixed hyperlipidemia: Secondary | ICD-10-CM | POA: Diagnosis not present

## 2023-06-29 DIAGNOSIS — Z9109 Other allergy status, other than to drugs and biological substances: Secondary | ICD-10-CM | POA: Diagnosis not present

## 2023-06-29 DIAGNOSIS — E782 Mixed hyperlipidemia: Secondary | ICD-10-CM | POA: Diagnosis not present

## 2023-06-29 DIAGNOSIS — F341 Dysthymic disorder: Secondary | ICD-10-CM | POA: Diagnosis not present

## 2023-06-29 DIAGNOSIS — K219 Gastro-esophageal reflux disease without esophagitis: Secondary | ICD-10-CM | POA: Diagnosis not present

## 2023-06-29 DIAGNOSIS — F419 Anxiety disorder, unspecified: Secondary | ICD-10-CM | POA: Diagnosis not present

## 2023-07-03 ENCOUNTER — Ambulatory Visit: Payer: Medicare HMO | Admitting: Orthopedic Surgery

## 2023-07-03 ENCOUNTER — Encounter: Payer: Self-pay | Admitting: Orthopedic Surgery

## 2023-07-03 DIAGNOSIS — M25511 Pain in right shoulder: Secondary | ICD-10-CM | POA: Diagnosis not present

## 2023-08-08 NOTE — Progress Notes (Signed)
Office Visit Note  Patient: Tina Bray             Date of Birth: 10-Feb-1951           MRN: [id number]             PCP: Daylene Katayama, PA Referring: Daylene Katayama, [place] Visit Date: 08/22/2023 Occupation: @GUAROCC @  Subjective:  Pain in right hip and left knee  History of Present Illness: Tina Bray is a 72 y.o. female with osteoarthritis and degenerative disc disease.  She states she has been experiencing discomfort in her right shoulder, right hip and left knee.  She had seen Dr. August Saucer recently at the time she did not have shoulder joint injection.  She states for the last few months she has been having progressive discomfort in her left knee joint.  She has been also experiencing discomfort in her right trochanteric bursa.  She is having difficulty walking and climbing stairs.  She has difficulty squatting and getting up.  She also has discomfort in the right trochanteric bursa which bothers her more while she is walking or sleeping on her side.  She has been seeing Dr. Al Corpus for feet pain.    Activities of Daily Living:  Patient reports morning stiffness for 30 minutes.   Patient Reports nocturnal pain.  Difficulty dressing/grooming: Denies Difficulty climbing stairs: Denies Difficulty getting out of chair: Denies Difficulty using hands for taps, buttons, cutlery, and/or writing: Denies  Review of Systems  Constitutional:  Negative for fatigue.  HENT:  Negative for mouth sores and mouth dryness.   Eyes:  Positive for dryness. Negative for pain.  Respiratory:  Negative for cough, shortness of breath and wheezing.   Cardiovascular:  Negative for chest pain and palpitations.  Gastrointestinal:  Negative for blood in stool, constipation and diarrhea.  Endocrine: Negative for increased urination.  Genitourinary:  Negative for involuntary urination.  Musculoskeletal:  Positive for joint pain, gait problem, joint pain, joint swelling, myalgias, muscle  weakness, morning stiffness, muscle tenderness and myalgias.  Skin:  Negative for color change, rash, hair loss and sensitivity to sunlight.  Allergic/Immunologic: Negative for susceptible to infections.  Neurological:  Negative for dizziness and headaches.  Hematological:  Negative for swollen glands.  Psychiatric/Behavioral:  Negative for depressed mood and sleep disturbance. The patient is not nervous/anxious.     PMFS History:  Patient Active Problem List   Diagnosis Date Noted   Peripheral arterial disease (HCC) 09/16/2022   Diverticulosis 11/18/2020   Arthritis of right shoulder region 11/18/2020   Dysthymia 11/18/2020   Panic attack 11/18/2020   Atrophic vaginitis 08/12/2020   Osteopenia 08/12/2020   Pain of left hand 07/30/2019   Arthritis of carpometacarpal [facility]) joint of right thumb 04/13/2018   Carpal tunnel syndrome of right wrist 01/22/2018   IBS (irritable bowel syndrome) 10/26/2015   Anxiety 11/14/2011   GERD (gastroesophageal reflux disease) 11/14/2011    Past Medical History:  Diagnosis Date   Allergy    Anxiety    Asthma    Depression    GERD (gastroesophageal reflux disease)    IBS (irritable bowel syndrome)    Kidney stones    Meningitis    Mitral valve prolapse    Osteopenia    Pneumonia     Family History  Problem Relation Age of Onset   Heart disease Mother 83       cardiac stenting/CAD   Hypertension Mother    Lung cancer Mother  Diabetes Mother    Cancer Mother    Stroke Father    Alcohol abuse Father    Heart attack Brother    Arthritis Brother    Anxiety disorder Daughter    Past Surgical History:  Procedure Laterality Date   APPENDECTOMY     CARPAL TUNNEL RELEASE     right and left wrist, right x 3   CHOLECYSTECTOMY     COLONOSCOPY  09/05/1998   normal; repeat in ten years.   COSMETIC SURGERY     eyelids   KNEE ARTHROSCOPY Left    orthoscopic shoulder     right shoulder   SPINE SURGERY     cervical spine C5-6    TONSILLECTOMY     TUBAL LIGATION     Social History   Social History Narrative   Marital status:  Married x 14 years; happily married.  No abuse.  Moved from [place].      Children:  2 daughters; 1 grandson.      Lives:  With husband.      Employment:  Surveyor, minerals.        Tobacco: none      Alcohol: 4 glasses of wine weekly.      Drugs:none      Exercise: yes; walking and running   Immunization History  Administered Date(s) Administered   Fluad Quad(high Dose 65+) 06/23/2019, 06/17/2020   Influenza Inj Mdck Quad With Preservative 06/16/2018   Influenza,inj,Quad PF,6+ Mos 06/04/2015, 07/08/2016   Influenza-Unspecified 06/16/2018, 06/24/2019, 06/05/2020, 06/08/2022   Moderna Covid-19 Fall Seasonal Vaccine 81yrs & older 06/07/2022, 05/15/2023   PFIZER Comirnaty(Gray Top)Covid-19 Tri-Sucrose Vaccine 01/02/2021   PFIZER(Purple Top)SARS-COV-2 Vaccination 10/27/2019, 11/20/2019   Pfizer Covid-19 Vaccine Bivalent Booster 41yrs & up 07/03/2021   Pneumococcal Conjugate-13 11/15/2017   Pneumococcal Polysaccharide-23 01/31/2019   Pneumococcal-Unspecified 06/14/2020, 06/08/2022   Respiratory Syncytial Virus Vaccine,Recomb Aduvanted(Arexvy) 06/07/2022   Tdap 12/11/2013   Zoster Recombinant(Shingrix) 07/19/2022   Zoster, Live 06/08/2022     Objective: Vital Signs: BP 138/85 (BP Location: Left Arm, Patient Position: Sitting, Cuff Size: Normal)   Pulse 69   Resp 15   Ht 5\' 2"  (1.575 m)   Wt 138 lb 3.2 oz (62.7 kg)   BMI 25.28 kg/m    Physical Exam Vitals and nursing note reviewed.  Constitutional:      Appearance: She is well-developed.  HENT:     Head: Normocephalic and atraumatic.  Eyes:     Conjunctiva/sclera: Conjunctivae normal.  Cardiovascular:     Rate and Rhythm: Normal rate and regular rhythm.     Heart sounds: Normal heart sounds.  Pulmonary:     Effort: Pulmonary effort is normal.     Breath sounds: Normal breath sounds.  Abdominal:     General:  Bowel sounds are normal.     Palpations: Abdomen is soft.  Musculoskeletal:     Cervical back: Normal range of motion.  Lymphadenopathy:     Cervical: No cervical adenopathy.  Skin:    General: Skin is warm and dry.     Capillary Refill: Capillary refill takes less than 2 seconds.  Neurological:     Mental Status: She is alert and oriented to person, place, and time.  Psychiatric:        Behavior: Behavior normal.      Musculoskeletal Exam: Equal, thoracic and lumbar spine were in good range of motion.  Shoulder joints, elbow joints, wrist joints in good range of motion.  She had bilateral CMC  PIP and DIP thickening with no synovitis.  She has some discomfort with range of motion of her right shoulder joint.  Hip joints had some limitation range of motion without any discomfort.  She had tenderness over right trochanteric bursa.  She had discomfort range of motion of her left knee joint without any warmth swelling or effusion.  Right knee joint was in good range of motion.  There was no tenderness over ankles or MTPs.  CDAI Exam: CDAI Score: -- Patient Global: --; Provider Global: -- Swollen: --; Tender: -- Joint Exam 08/22/2023   No joint exam has been documented for this visit   There is currently no information documented on the homunculus. Go to the Rheumatology activity and complete the homunculus joint exam.  Investigation: No additional findings.  Imaging: No results found.  Recent Labs: Lab Results  Component Value Date   WBC 3.3 (L) 11/15/2017   HGB 14.3 11/15/2017   PLT 220 11/15/2017   NA 141 01/01/2020   K 4.2 01/01/2020   CL 104 01/01/2020   CO2 24 01/01/2020   GLUCOSE 82 01/01/2020   BUN 16 01/01/2020   CREATININE 0.80 01/01/2020   BILITOT 0.5 01/01/2020   ALKPHOS 84 01/01/2020   AST 25 01/01/2020   ALT 22 01/01/2020   PROT 6.5 01/01/2020   ALBUMIN 4.4 01/01/2020   CALCIUM 9.9 01/01/2020   GFRAA 86 01/01/2020    Speciality Comments: No specialty  comments available.  Procedures:  Large Joint Inj: L knee on 08/22/2023 10:21 AM Indications: pain Details: 27 G 1.5 in needle, medial approach  Arthrogram: No  Medications: 40 mg triamcinolone acetonide 40 MG/ML; 1.5 mL lidocaine 1 % Aspirate: 0 mL Outcome: tolerated well, no immediate complications Procedure, treatment alternatives, risks and benefits explained, specific risks discussed. Consent was given by the patient. Immediately prior to procedure a time out was called to verify the correct patient, procedure, equipment, support staff and site/side marked as required. Patient was prepped and draped in the usual sterile fashion.     Allergies: Erythromycin, Demerol, Penicillamine, Penicillins, and Sulfa antibiotics   Assessment / Plan:     Visit Diagnoses: Primary osteoarthritis of both hands -patient continues to have some stiffness in her hands.  No synovitis was noted.  History of right CMC injection x2.  Right carpal tunnel release x3.  Chronic right shoulder pain-she had good response to cortisone injection given by Dr. August Saucer in the past.  She states she has been having some discomfort that she would like to avoid injection at this point.  Range of motion exercises were emphasized.  Trochanteric bursitis, right hip -she had recurrence of right trochanteric bursitis.  She has been having difficulty walking and also sleeping on the right side.  A handout on IT band stretches was given.  X-ray of the right hip joint was unremarkable in the past.  Chronic pain of both knees-she has recurrence of right knee joint.  X-rays from June 2024 were reviewed.  She had chondrocalcinosis.  No warmth swelling or effusion was noted on the examination today.  She also has some patellofemoral narrowing.  She had good response to cortisone injection in the past.  Patient requested repeat cortisone injection.  After informed consent was obtained right knee joint was injected with lidocaine and Kenalog  which she tolerated well.  Postprocedure instructions were given.  Chondrocalcinosis-was noted in the left knee x-rays in the past.  Primary osteoarthritis of both feet -she continues to have intermittent discomfort in  her ankles and her feet.  She is followed by podiatrist.  History of right ankle joint fracture in the past.  DDD (degenerative disc disease), cervical -she good range of motion of the cervical spine without any discomfort today.  X-rays obtained in the past showed multilevel spondylosis and facet joint arthropathy.  Status post C5-C6 fusion 1996.  History of osteopenia - I do not have DEXA results.  Other medical problems are listed as follows:  History of gastroesophageal reflux (GERD)  History of IBS  History of kidney stones  History of asthma  Anxiety and depression  Orders: Orders Placed This Encounter  Procedures   Large Joint Inj   No orders of the defined types were placed in this encounter.    Follow-Up Instructions: Return in about 6 months (around 02/20/2024) for Osteoarthritis.   Pollyann Savoy, MD  Note - This record has been created using Animal nutritionist.  Chart creation errors have been sought, but may not always  have been located. Such creation errors do not reflect on  the standard of medical care.

## 2023-08-15 DIAGNOSIS — I10 Essential (primary) hypertension: Secondary | ICD-10-CM | POA: Diagnosis not present

## 2023-08-22 ENCOUNTER — Encounter: Payer: Self-pay | Admitting: Rheumatology

## 2023-08-22 ENCOUNTER — Ambulatory Visit: Payer: Medicare HMO | Attending: Rheumatology | Admitting: Rheumatology

## 2023-08-22 VITALS — BP 138/85 | HR 69 | Resp 15 | Ht 62.0 in | Wt 138.2 lb

## 2023-08-22 DIAGNOSIS — F32A Depression, unspecified: Secondary | ICD-10-CM

## 2023-08-22 DIAGNOSIS — F419 Anxiety disorder, unspecified: Secondary | ICD-10-CM | POA: Diagnosis not present

## 2023-08-22 DIAGNOSIS — M25561 Pain in right knee: Secondary | ICD-10-CM | POA: Diagnosis not present

## 2023-08-22 DIAGNOSIS — M7061 Trochanteric bursitis, right hip: Secondary | ICD-10-CM

## 2023-08-22 DIAGNOSIS — M25562 Pain in left knee: Secondary | ICD-10-CM

## 2023-08-22 DIAGNOSIS — G8929 Other chronic pain: Secondary | ICD-10-CM

## 2023-08-22 DIAGNOSIS — M25511 Pain in right shoulder: Secondary | ICD-10-CM | POA: Diagnosis not present

## 2023-08-22 DIAGNOSIS — Z87442 Personal history of urinary calculi: Secondary | ICD-10-CM

## 2023-08-22 DIAGNOSIS — M5030 Other cervical disc degeneration, unspecified cervical region: Secondary | ICD-10-CM | POA: Diagnosis not present

## 2023-08-22 DIAGNOSIS — M1120 Other chondrocalcinosis, unspecified site: Secondary | ICD-10-CM

## 2023-08-22 DIAGNOSIS — M19042 Primary osteoarthritis, left hand: Secondary | ICD-10-CM | POA: Diagnosis not present

## 2023-08-22 DIAGNOSIS — Z8709 Personal history of other diseases of the respiratory system: Secondary | ICD-10-CM

## 2023-08-22 DIAGNOSIS — M19041 Primary osteoarthritis, right hand: Secondary | ICD-10-CM | POA: Diagnosis not present

## 2023-08-22 DIAGNOSIS — Z8719 Personal history of other diseases of the digestive system: Secondary | ICD-10-CM | POA: Diagnosis not present

## 2023-08-22 DIAGNOSIS — Z8739 Personal history of other diseases of the musculoskeletal system and connective tissue: Secondary | ICD-10-CM

## 2023-08-22 DIAGNOSIS — M19072 Primary osteoarthritis, left ankle and foot: Secondary | ICD-10-CM

## 2023-08-22 DIAGNOSIS — M19071 Primary osteoarthritis, right ankle and foot: Secondary | ICD-10-CM | POA: Diagnosis not present

## 2023-08-22 MED ORDER — TRIAMCINOLONE ACETONIDE 40 MG/ML IJ SUSP
40.0000 mg | INTRAMUSCULAR | Status: AC | PRN
Start: 2023-08-22 — End: 2023-08-22
  Administered 2023-08-22: 40 mg via INTRA_ARTICULAR

## 2023-08-22 MED ORDER — LIDOCAINE HCL 1 % IJ SOLN
1.5000 mL | INTRAMUSCULAR | Status: AC | PRN
Start: 2023-08-22 — End: 2023-08-22
  Administered 2023-08-22: 1.5 mL

## 2023-08-22 NOTE — Patient Instructions (Signed)
Iliotibial Band Syndrome Rehab Ask your health care provider which exercises are safe for you. Do exercises exactly as told by your provider and adjust them as told. It's normal to feel mild stretching, pulling, tightness, or discomfort as you do these exercises. Stop right away if you feel sudden pain or your pain gets a lot worse. Do not begin these exercises until told by your provider. Stretching and range-of-motion exercises These exercises warm up your muscles and joints. They also improve the movement and flexibility of your hip and pelvis. Quadriceps stretch, prone  Lie face down (prone) on a firm surface like a bed or padded floor. Bend your left / right knee. Reach back to hold your ankle or pant leg. If you can't reach your ankle or pant leg, use a belt looped around your foot and grab the belt instead. Gently pull your heel toward your butt. Your knee should not slide out to the side. You should feel a stretch in the front of your thigh and knee, also called the quadriceps. Hold this position for __________ seconds. Repeat __________ times. Complete this exercise __________ times a day. Iliotibial band stretch The iliotibial band is a strip of tissue that runs along the outside of your hip down to your knee. Lie on your side with your left / right leg on top. Bend both knees and grab your left / right ankle. Stretch out your bottom arm to help you balance. Slowly bring your top knee back so your thigh goes behind your back. Slowly lower your top leg toward the floor until you feel a gentle stretch on the outside of your left / right hip and thigh. If you don't feel a stretch and your knee won't go farther, place the heel of your other foot on top of your knee and pull your knee down toward the floor with your foot. Hold this position for __________ seconds. Repeat __________ times. Complete this exercise __________ times a day. Strengthening exercises These exercises build strength  and endurance in your hip and pelvis. Endurance means your muscles can keep working even when they're tired. Straight leg raises, side-lying This exercise strengthens the muscles that rotate the leg at the hip and move it away from your body. These muscles are called hip abductors. Lie on your side with your left / right leg on top. Lie so your head, shoulder, hip, and knee line up. You can bend your bottom knee to help you balance. Roll your hips slightly forward so they're stacked directly over each other. Your left / right knee should face forward. Tense the muscles in your outer thigh and hip. Lift your top leg 4-6 inches (10-15 cm) off the ground. Hold this position for __________ seconds. Slowly lower your leg back down to the starting position. Let your muscles fully relax before doing this exercise again. Repeat __________ times. Complete this exercise __________ times a day. Leg raises, prone This exercise strengthens the muscles that move the hips backward. These muscles are called hip extensors. Lie face down (prone) on your bed or a firm surface. You can put a pillow under your hips for comfort and to support your lower back. Bend your left / right knee so your foot points straight up toward the ceiling. Keep the other leg straight and behind you. Squeeze your butt muscles. Lift your left / right thigh off the firm surface. Do not let your back arch. Tense your thigh muscle as hard as you can without having  more knee pain. Hold this position for __________ seconds. Slowly lower your leg to the starting position. Allow your leg to relax all the way. Repeat __________ times. Complete this exercise __________ times a day. Hip hike  Stand sideways on a bottom step. Place your feet so that your left / right leg is on the step, and the other foot is hanging off the side. If you need support for balance, hold onto a railing or wall. Keep your knees straight and your abdomen square,  meaning your hips are level. Then, lift your left / right hip up toward the ceiling. Slowly let your leg that's hanging off the step lower towards the floor. Your foot should get closer to the ground. Do not lean or bend your knees during this movement. Repeat __________ times. Complete this exercise __________ times a day. This information is not intended to replace advice given to you by your health care provider. Make sure you discuss any questions you have with your health care provider. Document Revised: 11/04/2022 Document Reviewed: 11/04/2022 Elsevier Patient Education  2024 [employer] Exercises for Chronic Knee Pain Chronic knee pain is pain that lasts longer than 3 months. For most people with chronic knee pain, exercise and weight loss is an important part of treatment. Your health care provider may want you to focus on: Making the muscles that support your knee stronger. This can take pressure off your knee and reduce pain. Preventing knee stiffness. How far you can move your knee, keeping it there or making it farther. Losing weight (if this applies) to take pressure off your knee, lower your risk for injury, and make it easier for you to exercise. Your provider will help you make an exercise program that fits your needs and physical abilities. Below are simple, low-impact exercises you can do at home. Ask your provider or physical therapist how often you should do your exercise program and how many times to repeat each exercise. General safety tips  Get your provider's approval before doing any exercises. Start slowly and stop any time you feel pain. Do not exercise if your knee pain is flaring up. Warm up first. Stretching a cold muscle can cause an injury. Do 5-10 minutes of easy movement or light stretching before beginning your exercises. Do 5-10 minutes of low-impact activity (like walking or cycling) before starting strengthening exercises. Contact your provider any time  you have pain during or after exercising. Exercise can cause discomfort but should not be painful. It is normal to be a little stiff or sore after exercising. Stretching and range-of-motion exercises Front thigh stretch  Stand up straight and support your body by holding on to a chair or resting one hand on a wall. With your legs straight and close together, bend one knee to lift your heel up toward your butt. Using one hand for support, grab your ankle with your free hand. Pull your foot up closer toward your butt to feel the stretch in front of your thigh. Hold the stretch for 30 seconds. Repeat __________ times. Complete this exercise __________ times a day. Back thigh stretch  Sit on the floor with your back straight and your legs out straight in front of you. Place the palms of your hands on the floor and slide them toward your feet as you bend at the hip. Try to touch your nose to your knees and feel the stretch in the back of your thighs. Hold for 30 seconds. Repeat __________ times. Complete this  exercise __________ times a day. Calf stretch  Stand facing a wall. Place the palms of your hands flat against the wall, arms extended, and lean slightly against the wall. Get into a lunge position with one leg bent at the knee and the other leg stretched out straight behind you. Keep both feet facing the wall and increase the bend in your knee while keeping the heel of the other leg flat on the ground. You should feel the stretch in your calf. Hold for 30 seconds. Repeat __________ times. Complete this exercise __________ times a day. Strengthening exercises Straight leg lift  Lie on your back with one knee bent and the other leg out straight. Slowly lift the straight leg without bending the knee. Lift until your foot is about 12 inches (30 cm) off the floor. Hold for 3-5 seconds and slowly lower your leg. Repeat __________ times. Complete this exercise __________ times a  day. Single leg dip  Stand between two chairs and put both hands on the backs of the chairs for support. Extend one leg out straight with your body weight resting on the heel of the standing leg. Slowly bend your standing knee to dip your body to the level that is comfortable for you. Hold for 3-5 seconds. Repeat __________ times. Complete this exercise __________ times a day. Hamstring curls  Stand straight, knees close together, facing the back of a chair. Hold on to the back of a chair with both hands. Keep one leg straight. Bend the other knee while bringing the heel up toward the butt until the knee is bent at a 90-degree angle (right angle). Hold for 3-5 seconds. Repeat __________ times. Complete this exercise __________ times a day. Wall squat  Stand straight with your back, hips, and head against a wall. Step forward one foot at a time with your back still against the wall. Your feet should be 2 feet (61 cm) from the wall at shoulder width. Keeping your back, hips, and head against the wall, slide down the wall to as close to a sitting position as you can get. Hold for 5-10 seconds, then slowly slide back up. Repeat __________ times. Complete this exercise __________ times a day. Step-ups  Stand in front of a sturdy platform or stool that is about 6 inches (15 cm) high. Slowly step up with your left / right foot, keeping your knee in line with your hip and foot. Do not let your knee bend so far that you cannot see your toes. Hold on to a chair for balance, but do not use it for support. Slowly unlock your knee and lower yourself to the starting position. Repeat __________ times. Complete this exercise __________ times a day. Contact a health care provider if: Your exercises cause pain. Your pain is worse after you exercise. Your pain prevents you from doing your exercises. This information is not intended to replace advice given to you by your health care provider. Make sure  you discuss any questions you have with your health care provider. Document Revised: 09/06/2022 Document Reviewed: 09/06/2022 Elsevier Patient Education  2024 [employer].

## 2023-10-16 DIAGNOSIS — L821 Other seborrheic keratosis: Secondary | ICD-10-CM | POA: Diagnosis not present

## 2023-10-16 DIAGNOSIS — L814 Other melanin hyperpigmentation: Secondary | ICD-10-CM | POA: Diagnosis not present

## 2023-10-16 DIAGNOSIS — D1801 Hemangioma of skin and subcutaneous tissue: Secondary | ICD-10-CM | POA: Diagnosis not present

## 2023-10-16 DIAGNOSIS — L570 Actinic keratosis: Secondary | ICD-10-CM | POA: Diagnosis not present

## 2023-10-26 DIAGNOSIS — M8588 Other specified disorders of bone density and structure, other site: Secondary | ICD-10-CM | POA: Diagnosis not present

## 2023-10-26 DIAGNOSIS — Z1231 Encounter for screening mammogram for malignant neoplasm of breast: Secondary | ICD-10-CM | POA: Diagnosis not present

## 2023-11-11 DIAGNOSIS — B9689 Other specified bacterial agents as the cause of diseases classified elsewhere: Secondary | ICD-10-CM | POA: Diagnosis not present

## 2023-11-11 DIAGNOSIS — J019 Acute sinusitis, unspecified: Secondary | ICD-10-CM | POA: Diagnosis not present

## 2023-12-28 DIAGNOSIS — F419 Anxiety disorder, unspecified: Secondary | ICD-10-CM | POA: Diagnosis not present

## 2023-12-28 DIAGNOSIS — E782 Mixed hyperlipidemia: Secondary | ICD-10-CM | POA: Diagnosis not present

## 2023-12-28 DIAGNOSIS — M8580 Other specified disorders of bone density and structure, unspecified site: Secondary | ICD-10-CM | POA: Diagnosis not present

## 2023-12-28 DIAGNOSIS — Z9109 Other allergy status, other than to drugs and biological substances: Secondary | ICD-10-CM | POA: Diagnosis not present

## 2023-12-28 DIAGNOSIS — Z780 Asymptomatic menopausal state: Secondary | ICD-10-CM | POA: Diagnosis not present

## 2023-12-28 DIAGNOSIS — F341 Dysthymic disorder: Secondary | ICD-10-CM | POA: Diagnosis not present

## 2023-12-28 DIAGNOSIS — Z13220 Encounter for screening for lipoid disorders: Secondary | ICD-10-CM | POA: Diagnosis not present

## 2023-12-28 DIAGNOSIS — Z0000 Encounter for general adult medical examination without abnormal findings: Secondary | ICD-10-CM | POA: Diagnosis not present

## 2023-12-28 DIAGNOSIS — K219 Gastro-esophageal reflux disease without esophagitis: Secondary | ICD-10-CM | POA: Diagnosis not present

## 2024-02-06 NOTE — Progress Notes (Deleted)
 Office Visit Note  Patient: Tina Bray             Date of Birth: [birth year]           MRN: [id number]             PCP: Elias Gros, PA Referring: Elias Gros, [place] Visit Date: 02/20/2024 Occupation: @GUAROCC @  Subjective:  No chief complaint on file.   History of Present Illness: Tina Bray is a 73 y.o. female ***     Activities of Daily Living:  Patient reports morning stiffness for *** {minute/hour:19697}.   Patient {ACTIONS;DENIES/REPORTS:21021675::"Denies"} nocturnal pain.  Difficulty dressing/grooming: {ACTIONS;DENIES/REPORTS:21021675::"Denies"} Difficulty climbing stairs: {ACTIONS;DENIES/REPORTS:21021675::"Denies"} Difficulty getting out of chair: {ACTIONS;DENIES/REPORTS:21021675::"Denies"} Difficulty using hands for taps, buttons, cutlery, and/or writing: {ACTIONS;DENIES/REPORTS:21021675::"Denies"}  No Rheumatology ROS completed.   PMFS History:  Patient Active Problem List   Diagnosis Date Noted   Peripheral arterial disease (HCC) 09/16/2022   Diverticulosis 11/18/2020   Arthritis of right shoulder region 11/18/2020   Dysthymia 11/18/2020   Panic attack 11/18/2020   Atrophic vaginitis 08/12/2020   Osteopenia 08/12/2020   Pain of left hand 07/30/2019   Arthritis of carpometacarpal [facility]) joint of right thumb 04/13/2018   Carpal tunnel syndrome of right wrist 01/22/2018   IBS (irritable bowel syndrome) 10/26/2015   Anxiety 11/14/2011   GERD (gastroesophageal reflux disease) 11/14/2011    Past Medical History:  Diagnosis Date   Allergy    Anxiety    Asthma    Depression    GERD (gastroesophageal reflux disease)    IBS (irritable bowel syndrome)    Kidney stones    Meningitis    Mitral valve prolapse    Osteopenia    Pneumonia     Family History  Problem Relation Age of Onset   Heart disease Mother 61       cardiac stenting/CAD   Hypertension Mother    Lung cancer Mother    Diabetes Mother    Cancer Mother     Stroke Father    Alcohol abuse Father    Heart attack Brother    Arthritis Brother    Anxiety disorder Daughter    Past Surgical History:  Procedure Laterality Date   APPENDECTOMY     CARPAL TUNNEL RELEASE     right and left wrist, right x 3   CHOLECYSTECTOMY     COLONOSCOPY  09/05/1998   normal; repeat in ten years.   COSMETIC SURGERY     eyelids   KNEE ARTHROSCOPY Left    orthoscopic shoulder     right shoulder   SPINE SURGERY     cervical spine C5-6   TONSILLECTOMY     TUBAL LIGATION     Social History   Social History Narrative   Marital status:  Married x 14 years; happily married.  No abuse.  Moved from [place]  2005.      Children:  2 daughters; 1 grandson.      Lives:  With husband.      Employment:  Surveyor, minerals.        Tobacco: none      Alcohol: 4 glasses of wine weekly.      Drugs:none      Exercise: yes; walking and running   Immunization History  Administered Date(s) Administered   Fluad Quad(high Dose 65+) 06/23/2019, 06/17/2020   Influenza Inj Mdck Quad With Preservative 06/16/2018   Influenza,inj,Quad PF,6+ Mos 06/04/2015, 07/08/2016   Influenza-Unspecified 06/16/2018, 06/24/2019, 06/05/2020, 06/08/2022   Moderna  Covid-19 Fall Seasonal Vaccine 39yrs & older 06/07/2022, 05/15/2023   PFIZER Comirnaty(Gray Top)Covid-19 Tri-Sucrose Vaccine 01/02/2021   PFIZER(Purple Top)SARS-COV-2 Vaccination 10/27/2019, 11/20/2019   Pfizer Covid-19 Vaccine Bivalent Booster 20yrs & up 07/03/2021   Pneumococcal Conjugate-13 11/15/2017   Pneumococcal Polysaccharide-23 01/31/2019   Pneumococcal-Unspecified 06/14/2020, 06/08/2022   Respiratory Syncytial Virus Vaccine,Recomb Aduvanted(Arexvy) 06/07/2022   Tdap 12/11/2013   Zoster Recombinant(Shingrix) 07/19/2022   Zoster, Live 06/08/2022     Objective: Vital Signs: There were no vitals taken for this visit.   Physical Exam   Musculoskeletal Exam: ***  CDAI Exam: CDAI Score: -- Patient Global: --;  Provider Global: -- Swollen: --; Tender: -- Joint Exam 02/20/2024   No joint exam has been documented for this visit   There is currently no information documented on the homunculus. Go to the Rheumatology activity and complete the homunculus joint exam.  Investigation: No additional findings.  Imaging: No results found.  Recent Labs: Lab Results  Component Value Date   WBC 3.3 (L) 11/15/2017   HGB 14.3 11/15/2017   PLT 220 11/15/2017   NA 141 01/01/2020   K 4.2 01/01/2020   CL 104 01/01/2020   CO2 24 01/01/2020   GLUCOSE 82 01/01/2020   BUN 16 01/01/2020   CREATININE 0.80 01/01/2020   BILITOT 0.5 01/01/2020   ALKPHOS 84 01/01/2020   AST 25 01/01/2020   ALT 22 01/01/2020   PROT 6.5 01/01/2020   ALBUMIN 4.4 01/01/2020   CALCIUM 9.9 01/01/2020   GFRAA 86 01/01/2020    Speciality Comments: No specialty comments available.  Procedures:  No procedures performed Allergies: Erythromycin, Demerol, Penicillamine, Penicillins, and Sulfa antibiotics   Assessment / Plan:     Visit Diagnoses: No diagnosis found.  Orders: No orders of the defined types were placed in this encounter.  No orders of the defined types were placed in this encounter.   Face-to-face time spent with patient was *** minutes. Greater than 50% of time was spent in counseling and coordination of care.  Follow-Up Instructions: No follow-ups on file.   Dee Farber, CMA  Note - This record has been created using Animal nutritionist.  Chart creation errors have been sought, but may not always  have been located. Such creation errors do not reflect on  the standard of medical care.

## 2024-02-12 DIAGNOSIS — Z961 Presence of intraocular lens: Secondary | ICD-10-CM | POA: Diagnosis not present

## 2024-02-12 DIAGNOSIS — H40003 Preglaucoma, unspecified, bilateral: Secondary | ICD-10-CM | POA: Diagnosis not present

## 2024-02-20 ENCOUNTER — Ambulatory Visit: Payer: Medicare HMO | Admitting: Rheumatology

## 2024-02-20 DIAGNOSIS — Z87442 Personal history of urinary calculi: Secondary | ICD-10-CM

## 2024-02-20 DIAGNOSIS — M1120 Other chondrocalcinosis, unspecified site: Secondary | ICD-10-CM

## 2024-02-20 DIAGNOSIS — M19041 Primary osteoarthritis, right hand: Secondary | ICD-10-CM

## 2024-02-20 DIAGNOSIS — M19072 Primary osteoarthritis, left ankle and foot: Secondary | ICD-10-CM

## 2024-02-20 DIAGNOSIS — Z8739 Personal history of other diseases of the musculoskeletal system and connective tissue: Secondary | ICD-10-CM

## 2024-02-20 DIAGNOSIS — F419 Anxiety disorder, unspecified: Secondary | ICD-10-CM

## 2024-02-20 DIAGNOSIS — M5030 Other cervical disc degeneration, unspecified cervical region: Secondary | ICD-10-CM

## 2024-02-20 DIAGNOSIS — G8929 Other chronic pain: Secondary | ICD-10-CM

## 2024-02-20 DIAGNOSIS — M7061 Trochanteric bursitis, right hip: Secondary | ICD-10-CM

## 2024-02-20 DIAGNOSIS — Z8719 Personal history of other diseases of the digestive system: Secondary | ICD-10-CM

## 2024-02-20 DIAGNOSIS — Z8709 Personal history of other diseases of the respiratory system: Secondary | ICD-10-CM

## 2024-02-21 ENCOUNTER — Other Ambulatory Visit: Payer: Self-pay

## 2024-02-21 ENCOUNTER — Other Ambulatory Visit (INDEPENDENT_AMBULATORY_CARE_PROVIDER_SITE_OTHER): Payer: Self-pay

## 2024-02-21 ENCOUNTER — Ambulatory Visit: Admitting: Orthopedic Surgery

## 2024-02-21 DIAGNOSIS — M19011 Primary osteoarthritis, right shoulder: Secondary | ICD-10-CM

## 2024-02-21 DIAGNOSIS — M25511 Pain in right shoulder: Secondary | ICD-10-CM

## 2024-02-24 ENCOUNTER — Encounter: Payer: Self-pay | Admitting: Orthopedic Surgery

## 2024-02-24 MED ORDER — LIDOCAINE HCL 1 % IJ SOLN
5.0000 mL | INTRAMUSCULAR | Status: AC | PRN
  Administered 2024-02-21: 5 mL

## 2024-02-24 MED ORDER — TRIAMCINOLONE ACETONIDE 40 MG/ML IJ SUSP
40.0000 mg | INTRAMUSCULAR | Status: AC | PRN
  Administered 2024-02-21: 40 mg via INTRA_ARTICULAR

## 2024-02-24 MED ORDER — BUPIVACAINE HCL 0.5 % IJ SOLN
9.0000 mL | INTRAMUSCULAR | Status: AC | PRN
  Administered 2024-02-21: 9 mL via INTRA_ARTICULAR

## 2024-02-24 NOTE — Progress Notes (Signed)
 Office Visit Note   Patient: Tina Bray           Date of Birth: 05-Oct-1950           MRN: [id number] Visit Date: 02/21/2024 Requested by: Beverlie Crazier, PA [address] [place],  [place] [address] PCP: Beverlie Crazier, PA  Subjective: Chief Complaint  Patient presents with   Right Shoulder - Pain    HPI: Tina Bray is a 73 y.o. female who presents to the office reporting right shoulder pain.  Patient states she has had severe pain in the shoulder for months.  She is left-hand dominant.  Pain wakes her from sleep at night.  Had prior injection in the shoulder glenohumeral joint via ultrasound a year ago.  Over-the-counter medication is utilized.  Heat and rubs also utilized..                ROS: All systems reviewed are negative as they relate to the chief complaint within the history of present illness.  Patient denies fevers or chills.  Assessment & Plan: Visit Diagnoses:  1. Right shoulder pain, unspecified chronicity     Plan: Impression is right shoulder arthritis with maintenance of acromiohumeral distance and no change in large loose body in the axillary recess.  Glenohumeral joint injection performed today.  She will follow-up with us  as needed.  She has a pretty reasonable range of motion and strength at this time.  Follow-Up Instructions: No follow-ups on file.   Orders:  Orders Placed This Encounter  Procedures   US  Guided Needle Placement - No Linked Charges   XR Shoulder Right   No orders of the defined types were placed in this encounter.     Procedures: Large Joint Inj on 02/24/2024 9:57 PM   Large Joint Inj: R glenohumeral on 02/21/2024 9:58 PM Indications: diagnostic evaluation and pain Details: 22 G 3.5 in needle, ultrasound-guided posterior approach  Arthrogram: No  Medications: 9 mL bupivacaine 0.5 %; 5 mL lidocaine  1 %; 40 mg triamcinolone  acetonide 40 MG/ML Outcome: tolerated well, no immediate  complications Procedure, treatment alternatives, risks and benefits explained, specific risks discussed. Consent was given by the patient. Immediately prior to procedure a time out was called to verify the correct patient, procedure, equipment, support staff and site/side marked as required. Patient was prepped and draped in the usual sterile fashion.       Clinical Data: No additional findings.  Objective: Vital Signs: There were no vitals taken for this visit.  Physical Exam:  Constitutional: Patient appears well-developed HEENT:  Head: Normocephalic Eyes:EOM are normal Neck: Normal range of motion Cardiovascular: Normal rate Pulmonary/chest: Effort normal Neurologic: Patient is alert Skin: Skin is warm Psychiatric: Patient has normal mood and affect  Ortho Exam: Ortho exam demonstrates range of motion on the right to 45/90/125.  Rotator cuff strength intact infraspinatus supraspinatus and subscap muscle testing.  No masses lymphadenopathy or skin changes noted in the shoulder girdle region.  Some coarseness and grinding is present with internal/external rotation of the arm.  Cervical spine range of motion intact.  Specialty Comments:  No specialty comments available.  Imaging: No results found.   PMFS History: Patient Active Problem List   Diagnosis Date Noted   Peripheral arterial disease (HCC) 09/16/2022   Diverticulosis 11/18/2020   Arthritis of right shoulder region 11/18/2020   Dysthymia 11/18/2020   Panic attack 11/18/2020   Atrophic vaginitis 08/12/2020   Osteopenia 08/12/2020   Pain of  left hand 07/30/2019   Arthritis of carpometacarpal [facility]) joint of right thumb 04/13/2018   Carpal tunnel syndrome of right wrist 01/22/2018   IBS (irritable bowel syndrome) 10/26/2015   Anxiety 11/14/2011   GERD (gastroesophageal reflux disease) 11/14/2011   Past Medical History:  Diagnosis Date   Allergy    Anxiety    Asthma    Depression    GERD (gastroesophageal  reflux disease)    IBS (irritable bowel syndrome)    Kidney stones    Meningitis    Mitral valve prolapse    Osteopenia    Pneumonia     Family History  Problem Relation Age of Onset   Heart disease Mother 14       cardiac stenting/CAD   Hypertension Mother    Lung cancer Mother    Diabetes Mother    Cancer Mother    Stroke Father    Alcohol abuse Father    Heart attack Brother    Arthritis Brother    Anxiety disorder Daughter     Past Surgical History:  Procedure Laterality Date   APPENDECTOMY     CARPAL TUNNEL RELEASE     right and left wrist, right x 3   CHOLECYSTECTOMY     COLONOSCOPY  09/05/1998   normal; repeat in ten years.   COSMETIC SURGERY     eyelids   KNEE ARTHROSCOPY Left    orthoscopic shoulder     right shoulder   SPINE SURGERY     cervical spine C5-6   TONSILLECTOMY     TUBAL LIGATION     Social History   Occupational History   Occupation: retired  Tobacco Use   Smoking status: Never    Passive exposure: Past   Smokeless tobacco: Never  Vaping Use   Vaping status: Never Used  Substance and Sexual Activity   Alcohol use: Yes    Comment: occ   Drug use: No   Sexual activity: Yes    Birth control/protection: None

## 2024-03-20 DIAGNOSIS — H9313 Tinnitus, bilateral: Secondary | ICD-10-CM | POA: Diagnosis not present

## 2024-03-20 DIAGNOSIS — J309 Allergic rhinitis, unspecified: Secondary | ICD-10-CM | POA: Diagnosis not present

## 2024-03-20 DIAGNOSIS — H903 Sensorineural hearing loss, bilateral: Secondary | ICD-10-CM | POA: Diagnosis not present

## 2024-04-10 NOTE — Progress Notes (Unsigned)
 Office Visit Note  Patient: Tina Bray             Date of Birth: 1951/07/11           MRN: [id number]             PCP: Beverlie Crazier, PA Referring: Beverlie Crazier, [place] Visit Date: 04/11/2024 Occupation: @GUAROCC @  Subjective:  No chief complaint on file.   History of Present Illness: Tina Bray is a 73 y.o. female ***     Activities of Daily Living:  Patient reports morning stiffness for *** {minute/hour:19697}.   Patient {ACTIONS;DENIES/REPORTS:21021675::Denies} nocturnal pain.  Difficulty dressing/grooming: {ACTIONS;DENIES/REPORTS:21021675::Denies} Difficulty climbing stairs: {ACTIONS;DENIES/REPORTS:21021675::Denies} Difficulty getting out of chair: {ACTIONS;DENIES/REPORTS:21021675::Denies} Difficulty using hands for taps, buttons, cutlery, and/or writing: {ACTIONS;DENIES/REPORTS:21021675::Denies}  No Rheumatology ROS completed.   PMFS History:  Patient Active Problem List   Diagnosis Date Noted   Peripheral arterial disease (HCC) 09/16/2022   Diverticulosis 11/18/2020   Arthritis of right shoulder region 11/18/2020   Dysthymia 11/18/2020   Panic attack 11/18/2020   Atrophic vaginitis 08/12/2020   Osteopenia 08/12/2020   Pain of left hand 07/30/2019   Arthritis of carpometacarpal [facility]) joint of right thumb 04/13/2018   Carpal tunnel syndrome of right wrist 01/22/2018   IBS (irritable bowel syndrome) 10/26/2015   Anxiety 11/14/2011   GERD (gastroesophageal reflux disease) 11/14/2011    Past Medical History:  Diagnosis Date   Allergy    Anxiety    Asthma    Depression    GERD (gastroesophageal reflux disease)    IBS (irritable bowel syndrome)    Kidney stones    Meningitis    Mitral valve prolapse    Osteopenia    Pneumonia     Family History  Problem Relation Age of Onset   Heart disease Mother 68       cardiac stenting/CAD   Hypertension Mother    Lung cancer Mother    Diabetes Mother    Cancer Mother     Stroke Father    Alcohol abuse Father    Heart attack Brother    Arthritis Brother    Anxiety disorder Daughter    Past Surgical History:  Procedure Laterality Date   APPENDECTOMY     CARPAL TUNNEL RELEASE     right and left wrist, right x 3   CHOLECYSTECTOMY     COLONOSCOPY  09/05/1998   normal; repeat in ten years.   COSMETIC SURGERY     eyelids   KNEE ARTHROSCOPY Left    orthoscopic shoulder     right shoulder   SPINE SURGERY     cervical spine C5-6   TONSILLECTOMY     TUBAL LIGATION     Social History   Social History Narrative   Marital status:  Married x 14 years; happily married.  No abuse.  Moved from [place]  2005.      Children:  2 daughters; 1 grandson.      Lives:  With husband.      Employment:  Surveyor, minerals.        Tobacco: none      Alcohol: 4 glasses of wine weekly.      Drugs:none      Exercise: yes; walking and running   Immunization History  Administered Date(s) Administered   Fluad Quad(high Dose 65+) 06/23/2019, 06/17/2020   Influenza Inj Mdck Quad With Preservative 06/16/2018   Influenza,inj,Quad PF,6+ Mos 06/04/2015, 07/08/2016   Influenza-Unspecified 06/16/2018, 06/24/2019, 06/05/2020, 06/08/2022   Moderna  Covid-19 Fall Seasonal Vaccine 48yrs & older 06/07/2022, 05/15/2023   PFIZER Comirnaty(Gray Top)Covid-19 Tri-Sucrose Vaccine 01/02/2021   PFIZER(Purple Top)SARS-COV-2 Vaccination 10/27/2019, 11/20/2019   Pfizer Covid-19 Vaccine Bivalent Booster 41yrs & up 07/03/2021   Pneumococcal Conjugate-13 11/15/2017   Pneumococcal Polysaccharide-23 01/31/2019   Pneumococcal-Unspecified 06/14/2020, 06/08/2022   Respiratory Syncytial Virus Vaccine,Recomb Aduvanted(Arexvy) 06/07/2022   Tdap 12/11/2013   Zoster Recombinant(Shingrix) 07/19/2022   Zoster, Live 06/08/2022     Objective: Vital Signs: There were no vitals taken for this visit.   Physical Exam   Musculoskeletal Exam: ***  CDAI Exam: CDAI Score: -- Patient Global: --;  Provider Global: -- Swollen: --; Tender: -- Joint Exam 04/11/2024   No joint exam has been documented for this visit   There is currently no information documented on the homunculus. Go to the Rheumatology activity and complete the homunculus joint exam.  Investigation: No additional findings.  Imaging: No results found.  Recent Labs: Lab Results  Component Value Date   WBC 3.3 (L) 11/15/2017   HGB 14.3 11/15/2017   PLT 220 11/15/2017   NA 141 01/01/2020   K 4.2 01/01/2020   CL 104 01/01/2020   CO2 24 01/01/2020   GLUCOSE 82 01/01/2020   BUN 16 01/01/2020   CREATININE 0.80 01/01/2020   BILITOT 0.5 01/01/2020   ALKPHOS 84 01/01/2020   AST 25 01/01/2020   ALT 22 01/01/2020   PROT 6.5 01/01/2020   ALBUMIN 4.4 01/01/2020   CALCIUM 9.9 01/01/2020   GFRAA 86 01/01/2020    Speciality Comments: No specialty comments available.  Procedures:  No procedures performed Allergies: Erythromycin, Demerol, Penicillamine, Penicillins, and Sulfa antibiotics   Assessment / Plan:     Visit Diagnoses: No diagnosis found.  Orders: No orders of the defined types were placed in this encounter.  No orders of the defined types were placed in this encounter.   Face-to-face time spent with patient was *** minutes. Greater than 50% of time was spent in counseling and coordination of care.  Follow-Up Instructions: No follow-ups on file.   Daved JAYSON Gavel, CMA  Note - This record has been created using Animal nutritionist.  Chart creation errors have been sought, but may not always  have been located. Such creation errors do not reflect on  the standard of medical care.

## 2024-04-11 ENCOUNTER — Encounter: Payer: Self-pay | Admitting: Rheumatology

## 2024-04-11 ENCOUNTER — Ambulatory Visit: Attending: Rheumatology | Admitting: Rheumatology

## 2024-04-11 VITALS — BP 171/85 | HR 53 | Resp 15 | Ht 62.0 in | Wt 142.6 lb

## 2024-04-11 DIAGNOSIS — Z8719 Personal history of other diseases of the digestive system: Secondary | ICD-10-CM | POA: Diagnosis not present

## 2024-04-11 DIAGNOSIS — M25562 Pain in left knee: Secondary | ICD-10-CM

## 2024-04-11 DIAGNOSIS — M25561 Pain in right knee: Secondary | ICD-10-CM

## 2024-04-11 DIAGNOSIS — Z8709 Personal history of other diseases of the respiratory system: Secondary | ICD-10-CM

## 2024-04-11 DIAGNOSIS — M7061 Trochanteric bursitis, right hip: Secondary | ICD-10-CM | POA: Diagnosis not present

## 2024-04-11 DIAGNOSIS — F32A Depression, unspecified: Secondary | ICD-10-CM

## 2024-04-11 DIAGNOSIS — Z87442 Personal history of urinary calculi: Secondary | ICD-10-CM | POA: Diagnosis not present

## 2024-04-11 DIAGNOSIS — M19042 Primary osteoarthritis, left hand: Secondary | ICD-10-CM | POA: Diagnosis not present

## 2024-04-11 DIAGNOSIS — M19071 Primary osteoarthritis, right ankle and foot: Secondary | ICD-10-CM | POA: Diagnosis not present

## 2024-04-11 DIAGNOSIS — M25511 Pain in right shoulder: Secondary | ICD-10-CM | POA: Diagnosis not present

## 2024-04-11 DIAGNOSIS — M5030 Other cervical disc degeneration, unspecified cervical region: Secondary | ICD-10-CM

## 2024-04-11 DIAGNOSIS — Z8739 Personal history of other diseases of the musculoskeletal system and connective tissue: Secondary | ICD-10-CM | POA: Diagnosis not present

## 2024-04-11 DIAGNOSIS — F419 Anxiety disorder, unspecified: Secondary | ICD-10-CM | POA: Diagnosis not present

## 2024-04-11 DIAGNOSIS — M1120 Other chondrocalcinosis, unspecified site: Secondary | ICD-10-CM | POA: Diagnosis not present

## 2024-04-11 DIAGNOSIS — M19041 Primary osteoarthritis, right hand: Secondary | ICD-10-CM

## 2024-04-11 DIAGNOSIS — M19072 Primary osteoarthritis, left ankle and foot: Secondary | ICD-10-CM

## 2024-04-11 DIAGNOSIS — G8929 Other chronic pain: Secondary | ICD-10-CM

## 2024-04-11 MED ORDER — LIDOCAINE HCL 1 % IJ SOLN
1.5000 mL | INTRAMUSCULAR | Status: AC | PRN
  Administered 2024-04-11: 1.5 mL

## 2024-04-11 MED ORDER — TRIAMCINOLONE ACETONIDE 40 MG/ML IJ SUSP
40.0000 mg | INTRAMUSCULAR | Status: AC | PRN
  Administered 2024-04-11: 40 mg via INTRA_ARTICULAR

## 2024-07-08 ENCOUNTER — Encounter: Payer: Self-pay | Admitting: Radiology

## 2024-07-17 DIAGNOSIS — K219 Gastro-esophageal reflux disease without esophagitis: Secondary | ICD-10-CM | POA: Diagnosis not present

## 2024-07-17 DIAGNOSIS — F419 Anxiety disorder, unspecified: Secondary | ICD-10-CM | POA: Diagnosis not present

## 2024-07-17 DIAGNOSIS — R079 Chest pain, unspecified: Secondary | ICD-10-CM | POA: Diagnosis not present

## 2024-07-17 DIAGNOSIS — E782 Mixed hyperlipidemia: Secondary | ICD-10-CM | POA: Diagnosis not present

## 2024-07-17 DIAGNOSIS — I10 Essential (primary) hypertension: Secondary | ICD-10-CM | POA: Diagnosis not present

## 2024-08-05 DIAGNOSIS — R079 Chest pain, unspecified: Secondary | ICD-10-CM | POA: Diagnosis not present

## 2024-08-19 DIAGNOSIS — F419 Anxiety disorder, unspecified: Secondary | ICD-10-CM | POA: Diagnosis not present

## 2024-08-19 DIAGNOSIS — F5101 Primary insomnia: Secondary | ICD-10-CM | POA: Diagnosis not present

## 2024-10-01 NOTE — Progress Notes (Signed)
 Tina Bray                                          MRN: [id number]   10/01/2024   The [facility] Quality Team Specialist reviewed this patient medical record for the purposes of chart review for care gap closure. The following were reviewed: abstraction for care gap closure-controlling blood pressure.    [facility] Quality Team

## 2024-10-18 ENCOUNTER — Ambulatory Visit: Admitting: Rheumatology
